# Patient Record
Sex: Male | Born: 1960 | Race: White | Hispanic: No | Marital: Married | Smoking: Never smoker
Health system: Southern US, Community
[De-identification: ages and names within clinical notes are randomized; demographics above are authoritative.]

## PROBLEM LIST (undated history)

## (undated) DIAGNOSIS — B029 Zoster without complications: Secondary | ICD-10-CM

## (undated) DIAGNOSIS — R972 Elevated prostate specific antigen [PSA]: Secondary | ICD-10-CM

## (undated) DIAGNOSIS — H00019 Hordeolum externum unspecified eye, unspecified eyelid: Secondary | ICD-10-CM

## (undated) DIAGNOSIS — L719 Rosacea, unspecified: Secondary | ICD-10-CM

## (undated) DIAGNOSIS — I493 Ventricular premature depolarization: Secondary | ICD-10-CM

## (undated) DIAGNOSIS — K08409 Partial loss of teeth, unspecified cause, unspecified class: Secondary | ICD-10-CM

## (undated) DIAGNOSIS — E785 Hyperlipidemia, unspecified: Secondary | ICD-10-CM

## (undated) HISTORY — DX: Zoster without complications: B02.9

## (undated) HISTORY — DX: Elevated prostate specific antigen (PSA): R97.20

## (undated) HISTORY — DX: Rosacea, unspecified: L71.9

## (undated) HISTORY — DX: Hordeolum externum unspecified eye, unspecified eyelid: H00.019

## (undated) HISTORY — DX: Hyperlipidemia, unspecified: E78.5

## (undated) HISTORY — PX: WISDOM TOOTH EXTRACTION: SHX21

## (undated) HISTORY — DX: Partial loss of teeth, unspecified cause, unspecified class: K08.409

---

## 2006-10-26 DIAGNOSIS — R972 Elevated prostate specific antigen [PSA]: Secondary | ICD-10-CM

## 2006-10-26 DIAGNOSIS — B029 Zoster without complications: Secondary | ICD-10-CM

## 2006-10-26 HISTORY — DX: Zoster without complications: B02.9

## 2006-10-26 HISTORY — DX: Elevated prostate specific antigen (PSA): R97.20

## 2006-10-26 HISTORY — PX: PROSTATE BIOPSY: SHX241

## 2013-10-26 DIAGNOSIS — H00019 Hordeolum externum unspecified eye, unspecified eyelid: Secondary | ICD-10-CM

## 2013-10-26 HISTORY — DX: Hordeolum externum unspecified eye, unspecified eyelid: H00.019

## 2014-02-15 ENCOUNTER — Encounter: Payer: Self-pay | Admitting: Medical

## 2014-02-15 ENCOUNTER — Ambulatory Visit (INDEPENDENT_AMBULATORY_CARE_PROVIDER_SITE_OTHER): Payer: BC Managed Care – PPO | Admitting: Medical

## 2014-02-15 VITALS — BP 124/90 | HR 80 | Temp 97.7°F | Resp 18 | Ht 73.0 in | Wt 208.0 lb

## 2014-02-15 DIAGNOSIS — Z23 Encounter for immunization: Secondary | ICD-10-CM

## 2014-02-15 DIAGNOSIS — Z0000 Encounter for general adult medical examination without abnormal findings: Secondary | ICD-10-CM

## 2014-02-15 DIAGNOSIS — Z1211 Encounter for screening for malignant neoplasm of colon: Secondary | ICD-10-CM

## 2014-02-15 DIAGNOSIS — R9431 Abnormal electrocardiogram [ECG] [EKG]: Secondary | ICD-10-CM

## 2014-02-15 DIAGNOSIS — Z87898 Personal history of other specified conditions: Secondary | ICD-10-CM

## 2014-02-15 LAB — CBC
HCT: 45.5 % (ref 39.0–52.0)
Hemoglobin: 15.5 g/dL (ref 13.0–17.0)
MCH: 30.5 pg (ref 26.0–34.0)
MCHC: 34.1 g/dL (ref 30.0–36.0)
MCV: 89.6 fL (ref 78.0–100.0)
Platelets: 212 10*3/uL (ref 150–400)
RBC: 5.08 MIL/uL (ref 4.22–5.81)
RDW: 12.9 % (ref 11.5–15.5)
WBC: 5.9 10*3/uL (ref 4.0–10.5)

## 2014-02-15 LAB — LIPID PANEL
Cholesterol: 251 mg/dL — ABNORMAL HIGH (ref 0–200)
HDL: 48 mg/dL (ref >39)
LDL Cholesterol: 183 mg/dL — ABNORMAL HIGH (ref 0–99)
Total CHOL/HDL Ratio: 5.2 Ratio
Triglycerides: 98 mg/dL (ref <150)
VLDL: 20 mg/dL (ref 0–40)

## 2014-02-15 LAB — POCT URINALYSIS DIPSTICK
Bilirubin, UA: NEGATIVE
Blood, UA: NEGATIVE
Glucose, UA: NEGATIVE
Ketones, UA: NEGATIVE
Leukocytes, UA: NEGATIVE
Nitrite, UA: NEGATIVE
Protein, UA: NEGATIVE
Spec Grav, UA: 1.01
Urobilinogen, UA: NEGATIVE
pH, UA: 5

## 2014-02-15 LAB — COMPREHENSIVE METABOLIC PANEL
ALT: 25 U/L (ref 0–53)
AST: 24 U/L (ref 0–37)
Albumin: 4.4 g/dL (ref 3.5–5.2)
Alkaline Phosphatase: 46 U/L (ref 39–117)
BUN: 14 mg/dL (ref 6–23)
CO2: 26 mEq/L (ref 19–32)
Calcium: 9.1 mg/dL (ref 8.4–10.5)
Chloride: 101 mEq/L (ref 96–112)
Creat: 0.91 mg/dL (ref 0.50–1.35)
Glucose, Bld: 99 mg/dL (ref 70–99)
Potassium: 4.2 mEq/L (ref 3.5–5.3)
Sodium: 136 mEq/L (ref 135–145)
Total Bilirubin: 0.7 mg/dL (ref 0.2–1.2)
Total Protein: 7 g/dL (ref 6.0–8.3)

## 2014-02-15 NOTE — Progress Notes (Signed)
Subjective:   HPI  Devin Nunez is a 53 y.o. male who presents for a complete physical.  New patient.  Moved from [place] to [place] few years ago.  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 Been in usual state of health without c/o except for hordeolum.   Sees Dr. Dione Booze for this.   Preventative care: 2010 Last ophthalmology visit:2015 Dr Dione Booze Last dental visit:2015 Dr Alvina Filbert Last colonoscopy:never Last prostate exam: biopsy 2010 Last EKG:2008 Last labs: 2010  Prior vaccinations: TD or Tdap:unknown Influenza:no Pneumococcal:no Shingles/Zostavax:no  Advanced directive:no Health care power of attorney:no Living will:no  Concerns: none  Reviewed their medical, surgical, family, social, medication, and allergy history and updated chart as appropriate.  Past Medical History  Diagnosis Date  . Elevated PSA 2008    hx/o with benign biopsy  . Shingles 2008    chest/back  . Hordeolum 2015    sees Dr. Dione Booze, on Doxycycline  . Wisdom teeth removed     Past Surgical History  Procedure Laterality Date  . Prostate biopsy  2008    reportedly benign    History   Social History  . Marital Status: Married    Spouse Name: N/A    Number of Children: N/A  . Years of Education: N/A   Occupational History  . Not on file.   Social History Main Topics  . Smoking status: Never Smoker   . Smokeless tobacco: Not on file  . Alcohol Use: 4.8 oz/week    8 Cans of beer per week  . Drug Use: No  . Sexual Activity: Not on file   Other Topics Concern  . 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 Heart disease Father     CABG in his '80s  . Diabetes Neg Hx   . Hypertension Neg Hx   . Stroke Neg Hx   . Cancer Cousin     colon    Current outpatient prescriptions:doxycycline  (DORYX) 100 MG EC tablet, Take 100 mg by mouth daily., Disp: , Rfl:   No Known Allergies     Review of Systems Constitutional: -fever, -chills, -sweats, -unexpected weight change, -decreased appetite, -fatigue Allergy: -sneezing, -itching, -congestion Dermatology: -changing moles, --rash, -lumps ENT: -runny nose, -ear pain, -sore throat, -hoarseness, -sinus pain, -teeth pain, - ringing in ears, -hearing loss, -nosebleeds Cardiology: -chest pain, -palpitations, -swelling, -difficulty breathing when lying flat, -waking up short of breath Respiratory: -cough, -shortness of breath, -difficulty breathing with exercise or exertion, -wheezing, -coughing up blood Gastroenterology: -abdominal pain, -nausea, -vomiting, -diarrhea, -constipation, +-blood in stool, -changes in bowel movement, -difficulty swallowing or eating Hematology: -bleeding, -bruising  Musculoskeletal: -joint aches, -muscle aches, -joint swelling, -back pain, -neck pain, -cramping, -changes in gait Ophthalmology: denies vision changes, eye redness, itching, discharge Urology: -burning with urination, -difficulty urinating, -blood in urine, -urinary frequency, -urgency, -incontinence Neurology: -headache, -weakness, -tingling, -numbness, -memory loss, -falls, -dizziness Psychology: -depressed mood, -agitation, -sleep problems     Objective:   Physical Exam  BP 124/90  Pulse 80  Temp(Src) 97.7 F (36.5 C) (Oral)  Resp 18  Ht  (1.854 m)  Wt 208 lb (94.348 kg)  BMI 27.45 kg/m2  General appearance: alert, no distress, WD/WN, white male Skin: left eyelid upper with mild erythema and hordeolum noted, otherwise scattered macules,no worrisome  lesions HEENT: normocephalic, conjunctiva/corneas normal, sclerae anicteric, PERRLA, EOMi, nares patent, no discharge or erythema, pharynx normal Oral cavity: MMM, tongue normal, teeth normal Neck: supple, no lymphadenopathy, no thyromegaly, no masses, normal ROM, no bruits Chest:  non tender, normal shape and expansion Heart: RRR, normal S1, S2, no murmurs Lungs: CTA bilaterally, no wheezes, rhonchi, or rales Abdomen: +bs, soft, non tender, non distended, no masses, no hepatomegaly, no splenomegaly, no bruits Back: non tender, normal ROM, no scoliosis Musculoskeletal: upper extremities non tender, no obvious deformity, normal ROM throughout, lower extremities non tender, no obvious deformity, normal ROM throughout Extremities: no edema, no cyanosis, no clubbing Pulses: His toes and feet are cool to the touch, decreased cap refill but he says this is not typical for him and it is cold in exam room, otherwise 2+ symmetric, upper and lower extremities, normal cap refill Neurological: alert, oriented x 3, CN2-12 intact, strength normal upper extremities and lower extremities, sensation normal throughout, DTRs 2+ throughout, no cerebellar signs, gait normal Psychiatric: normal affect, behavior normal, pleasant  GU: normal male external genitalia, circumcised, nontender, no masses, no hernia, no lymphadenopathy Rectal: anus normal tone, prostate WNL, occult negative stool   Adult ECG Report  Indication: skipped beats on pulse exam  Rate: 92bpm  Rhythm: normal sinus rhythm and premature ventricular contractions (PVC)  QRS Axis: 21 degrees  PR Interval:  QRS Duration: 78ms  QTc:  Conduction Disturbances: none  Other Abnormalities: none  Patient's cardiac risk factors are: male gender and sedentary lifestyle.  EKG comparison: none  Narrative Interpretation: PVCs, no acute changes otherwise     Assessment and Plan :      Encounter Diagnoses  Name Primary?  . Routine general medical examination at a health care facility Yes  . Nonspecific abnormal electrocardiogram (ECG) (EKG)   . Need for diphtheria-tetanus-pertussis (Tdap) vaccine, adult/adolescent   . History of elevated PSA   . Special screening for malignant neoplasms, colon     Physical exam -  discussed healthy lifestyle, diet, exercise, preventative care, vaccinations, and addressed their concerns.   EKG abnormal - PVCs, asymptomatic, borderling DBP.  Will monitor.  Consider stress test or echo pending labs.  tdap vaccine counseling given, VIS given and Tdap administered. Hx/o elevated PSA  - normal prostate exam, f/u pending PSA lab Referral for first colonoscopy Follow-up pending labs

## 2014-02-16 LAB — PSA: PSA: 1.62 ng/mL (ref <=4.00)

## 2014-02-16 LAB — TSH: TSH: 1.238 u[IU]/mL (ref 0.350–4.500)

## 2014-02-23 ENCOUNTER — Other Ambulatory Visit: Payer: Self-pay | Admitting: Medical

## 2014-02-23 MED ORDER — ATORVASTATIN CALCIUM 20 MG PO TABS: 20.0000 mg | ORAL_TABLET | Freq: Every day | ORAL | Status: DC

## 2014-02-26 ENCOUNTER — Other Ambulatory Visit: Payer: Self-pay | Admitting: Family Medicine

## 2014-02-26 DIAGNOSIS — R9431 Abnormal electrocardiogram [ECG] [EKG]: Secondary | ICD-10-CM

## 2014-03-02 ENCOUNTER — Telehealth (HOSPITAL_COMMUNITY): Payer: Self-pay | Admitting: *Deleted

## 2014-03-02 ENCOUNTER — Encounter: Payer: Self-pay | Admitting: Cardiology

## 2014-03-02 DIAGNOSIS — I493 Ventricular premature depolarization: Secondary | ICD-10-CM | POA: Insufficient documentation

## 2014-03-02 DIAGNOSIS — E785 Hyperlipidemia, unspecified: Secondary | ICD-10-CM

## 2014-03-02 NOTE — Progress Notes (Unsigned)
Patient ID: Devin Nunez, male   DOB: 11/15/1960, 53 y.o.   MRN: [id number]   Devin Nunez  Date of visit:  03/02/2014 DOB:  04-21-1961    Age:  53 yrs. Medical record number:  [id number] Account number:  [id number] Primary Care Provider: [name], Devin Nunez ____________________________ CURRENT DIAGNOSES  1. PVC''s  2. Hyperlipidemia, unspecified  ____________________________ ALLERGIES  No Known Allergies ____________________________ MEDICATIONS  1. doxycycline hyclate 100 mg capsule, 1 p.o. daily  2. atorvastatin 20 mg tablet, 1 p.o. daily  3. aspirin 81 mg chewable tablet, 1 p.o. daily ____________________________ CHIEF COMPLAINTS  Irrg puse ____________________________ HISTORY OF PRESENT ILLNESS  This very nice 53 year old male is seen for evaluation of PVCs. He recently saw his primary care provider for a physical and PVCs were noted. These are asymptomatic. The patient was diagnosed with hyperlipidemia and was treated with a dose of statin therapy. He does have a family history of bypass surgery and his father at an older age of 54. He denies hypertension and has no PND, orthopnea, syncope, or palpitations. He is unaware that he has PVCs. He drinks occasional beer on the weekend but does not use caffeine to excess and has no other medical issues. ____________________________ PAST HISTORY  Past Medical Illnesses:  hyperlipidemia;  Cardiovascular Illnesses:  arrhythmia-PVCs;  Surgical Procedures:  wisdom teeth extraction, prostate bx;  Cardiology Procedures-Invasive:  no history of prior cardiac procedures;  Cardiology Procedures-Noninvasive:  no previous non-invasive procedures;  LVEF not documented,   ____________________________ CARDIO-PULMONARY TEST DATES EKG Date:  03/02/2014;   ____________________________ FAMILY HISTORY Brother -- Brother alive and well Brother -- Brother alive and well Brother -- Brother alive and well Father -- Father alive with problem, Coronary artery bypass  grafting Mother -- Mother alive and well Sister -- Sister alive and well ____________________________ SOCIAL HISTORY Alcohol Use:  8 beer q week;  Smoking:  never smoked;  Diet:  regular diet;  Lifestyle:  married, 1 son and 1 daughter;  Exercise:  no regular exercise;  Occupation:  owns Therapist, 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 Eyes: eyelid infection Ears, Nose, Throat, Mouth:  denies any hearing loss, epistaxis, hoarseness or difficulty speaking. Respiratory: denies dyspnea, cough, wheezing or hemoptysis. Cardiovascular:  please review HPI Abdominal: denies dyspepsia, GI bleeding, constipation, or diarrhea Genitourinary-Male: no dysuria, urgency, frequency, or nocturia  Musculoskeletal:  denies arthritis, venous insufficiency, or muscle weakness Neurological:  denies headaches, stroke, or TIA  ____________________________ PHYSICAL EXAMINATION VITAL SIGNS  Blood Pressure:  136/60 Sitting, Right arm, regular cuff  , 124/68 Standing, Right arm and regular cuff   Pulse:  78/min. Weight:  211.00 lbs. Height:  71"BMI: 29  Constitutional:  pleasant white male in no acute distress Skin:  warm and dry to touch, no apparent skin lesions, or masses noted. Head:  normocephalic, normal hair pattern, no masses or tenderness Eyes:  EOMS Intact, PERRLA, C and S clear, Funduscopic exam not done. ENT:  ears, nose and throat reveal no gross abnormalities.  Dentition good. Neck:  supple, without massess. No JVD, thyromegaly or carotid bruits. Carotid upstroke normal. Chest:  normal symmetry, clear to auscultation. Cardiac:  regular rhythm, normal S1 and S2, No S3 or S4, no murmurs, gallops or rubs detected. Abdomen:  abdomen soft,non-tender, no masses, no hepatospenomegaly, or aneurysm noted Peripheral Pulses:  the femoral,dorsalis  pedis, and  posterior tibial pulses are full and equal bilaterally with no bruits auscultated. Extremities & Back:  no deformities, clubbing, cyanosis, erythema or edema observed. Normal muscle strength and tone. Neurological:  no gross motor or sensory deficits noted, affect appropriate, oriented x3. ____________________________ MOST RECENT LIPID PANEL 02/15/14  CHOL TOTL 251 mg/dl, LDL 098183 NM, HDL 48 mg/dl, TRIGLYCER 98 mg/dl,  CHOL/HDL 5.2 (Calc)  ____________________________ IMPRESSIONS/PLAN  1. Asymptomatic PVCs 2. Overweight 3. Hyperlipidemia under treatment  Recommendations:  Patient education regarding PVCs. At this point I will like him to have an echocardiogram to be sure that he does not have any structural heart disease and otherwise I think he can be reassured. No treatment is necessary for the PVCs. He was encouraged to continue to try to lose weight and engage in healthy lifestyle behaviors. ____________________________ TODAYS ORDERS  1. 2D, color flow, doppler: At Patient [facility]  2. 12 Lead EKG: Today                       ____________________________ Cardiology Physician:  Darden PalmerW. Spencer Jackie Littlejohn, Jr. MD [facility]

## 2014-03-06 ENCOUNTER — Telehealth: Payer: Self-pay | Admitting: Family Medicine

## 2014-03-06 NOTE — Telephone Encounter (Signed)
PATIENT IS AWARE OF HIS APPOINTMENT TO SEE DR. MANN ON ,AY 12, 2015 @ 945 AM. [phone]  i [facility]AC[facility] C[facility]

## 2014-03-08 ENCOUNTER — Telehealth (HOSPITAL_COMMUNITY): Payer: Self-pay | Admitting: *Deleted

## 2014-03-09 ENCOUNTER — Telehealth (HOSPITAL_COMMUNITY): Payer: Self-pay | Admitting: *Deleted

## 2014-03-30 ENCOUNTER — Encounter: Payer: Self-pay | Admitting: Medical

## 2014-04-25 HISTORY — PX: COLONOSCOPY: SHX174

## 2014-05-25 ENCOUNTER — Encounter: Payer: Self-pay | Admitting: Internal Medicine

## 2014-05-25 LAB — HM COLONOSCOPY

## 2014-06-16 ENCOUNTER — Other Ambulatory Visit: Payer: Self-pay | Admitting: Medical

## 2014-10-01 ENCOUNTER — Other Ambulatory Visit: Payer: Self-pay | Admitting: Medical

## 2014-12-06 ENCOUNTER — Other Ambulatory Visit: Payer: Self-pay | Admitting: Medical

## 2015-02-08 ENCOUNTER — Other Ambulatory Visit: Payer: Self-pay | Admitting: Medical

## 2015-03-08 ENCOUNTER — Other Ambulatory Visit: Payer: Self-pay | Admitting: Medical

## 2016-10-02 ENCOUNTER — Encounter: Payer: Self-pay | Admitting: Medical

## 2016-10-30 ENCOUNTER — Encounter: Payer: Self-pay | Admitting: Medical

## 2016-10-30 ENCOUNTER — Ambulatory Visit (INDEPENDENT_AMBULATORY_CARE_PROVIDER_SITE_OTHER): Payer: BLUE CROSS/BLUE SHIELD | Admitting: Medical

## 2016-10-30 ENCOUNTER — Telehealth: Payer: Self-pay | Admitting: Medical

## 2016-10-30 VITALS — BP 128/84 | HR 86 | Ht 73.5 in | Wt 208.6 lb

## 2016-10-30 DIAGNOSIS — Z0000 Encounter for general adult medical examination without abnormal findings: Secondary | ICD-10-CM | POA: Diagnosis not present

## 2016-10-30 DIAGNOSIS — Z125 Encounter for screening for malignant neoplasm of prostate: Secondary | ICD-10-CM

## 2016-10-30 DIAGNOSIS — Z800 Family history of malignant neoplasm of digestive organs: Secondary | ICD-10-CM | POA: Insufficient documentation

## 2016-10-30 DIAGNOSIS — E785 Hyperlipidemia, unspecified: Secondary | ICD-10-CM

## 2016-10-30 DIAGNOSIS — Z1329 Encounter for screening for other suspected endocrine disorder: Secondary | ICD-10-CM | POA: Insufficient documentation

## 2016-10-30 DIAGNOSIS — Z8249 Family history of ischemic heart disease and other diseases of the circulatory system: Secondary | ICD-10-CM | POA: Diagnosis not present

## 2016-10-30 LAB — POCT URINALYSIS DIPSTICK
Bilirubin, UA: NEGATIVE
Blood, UA: NEGATIVE
Clarity, UA: NEGATIVE
Glucose, UA: NEGATIVE
Ketones, UA: NEGATIVE
Leukocytes, UA: NEGATIVE
Nitrite, UA: NEGATIVE
Protein, UA: NEGATIVE
Spec Grav, UA: >=1.03
Urobilinogen, UA: NEGATIVE
pH, UA: 6

## 2016-10-30 LAB — LIPID PANEL
Cholesterol: 274 mg/dL — ABNORMAL HIGH (ref <200)
HDL: 59 mg/dL (ref >40)
LDL Cholesterol: 181 mg/dL — ABNORMAL HIGH (ref <100)
Total CHOL/HDL Ratio: 4.6 Ratio (ref <5.0)
Triglycerides: 168 mg/dL — ABNORMAL HIGH (ref <150)
VLDL: 34 mg/dL — ABNORMAL HIGH (ref <30)

## 2016-10-30 LAB — COMPREHENSIVE METABOLIC PANEL
ALT: 39 U/L (ref 9–46)
AST: 34 U/L (ref 10–35)
Albumin: 4.3 g/dL (ref 3.6–5.1)
Alkaline Phosphatase: 44 U/L (ref 40–115)
BUN: 15 mg/dL (ref 7–25)
CO2: 26 mmol/L (ref 20–31)
Calcium: 9.4 mg/dL (ref 8.6–10.3)
Chloride: 105 mmol/L (ref 98–110)
Creat: 0.92 mg/dL (ref 0.70–1.33)
Glucose, Bld: 109 mg/dL — ABNORMAL HIGH (ref 65–99)
Potassium: 4.7 mmol/L (ref 3.5–5.3)
Sodium: 139 mmol/L (ref 135–146)
Total Bilirubin: 0.7 mg/dL (ref 0.2–1.2)
Total Protein: 7 g/dL (ref 6.1–8.1)

## 2016-10-30 LAB — CBC WITH DIFFERENTIAL/PLATELET
Basophils Absolute: 44 cells/uL (ref 0–200)
Basophils Relative: 1 %
Eosinophils Absolute: 132 cells/uL (ref 15–500)
Eosinophils Relative: 3 %
HCT: 45 % (ref 38.5–50.0)
Hemoglobin: 15 g/dL (ref 13.2–17.1)
Lymphocytes Relative: 29 %
Lymphs Abs: 1276 cells/uL (ref 850–3900)
MCH: 30.4 pg (ref 27.0–33.0)
MCHC: 33.3 g/dL (ref 32.0–36.0)
MCV: 91.3 fL (ref 80.0–100.0)
MPV: 9.5 fL (ref 7.5–12.5)
Monocytes Absolute: 660 cells/uL (ref 200–950)
Monocytes Relative: 15 %
Neutro Abs: 2288 cells/uL (ref 1500–7800)
Neutrophils Relative %: 52 %
Platelets: 224 10*3/uL (ref 140–400)
RBC: 4.93 MIL/uL (ref 4.20–5.80)
RDW: 13 % (ref 11.0–15.0)
WBC: 4.4 10*3/uL (ref 4.0–10.5)

## 2016-10-30 LAB — HEMOGLOBIN A1C
Hgb A1c MFr Bld: 5.3 % (ref <5.7)
Mean Plasma Glucose: 105 mg/dL

## 2016-10-30 LAB — PSA: PSA: 2.8 ng/mL (ref <=4.0)

## 2016-10-30 NOTE — Progress Notes (Signed)
Subjective:   HPI Chief Complaint  Patient presents with  . physical    physical     Devin Nunez is a 56 y.o. male who presents for a complete physical.  Last visit 2015.  Been doing well.  Concerned about heart disease.  Eats relatively healthy, exercises, tries to stay in good health, but has heart disease in father and brother's history.    Reviewed their medical, surgical, family, social, medication, and allergy history and updated chart as appropriate.  Past Medical History:  Diagnosis Date  . Elevated PSA 2008   hx/o with benign biopsy  . Hordeolum 2015   sees Dr. Dione BoozeGroat, on Doxycycline  . Shingles 2008   chest/back  . Wisdom teeth removed     Past Surgical History:  Procedure Laterality Date  . COLONOSCOPY  04/2014   normal colonoscopy, Dr. Loreta AveMann  . PROSTATE BIOPSY  2008   reportedly benign    Social History   Social History  . Marital status: Married    Spouse name: N/A  . Number of children: N/A  . Years of education: N/A   Occupational History  . Not on file.   Social History Main Topics  . Smoking status: Never Smoker  . Smokeless tobacco: Never Used  . Alcohol use 4.8 oz/week    8 Cans of beer per week  . Drug use: No  . Sexual activity: Not on file   Other Topics Concern  . 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  10/2016    Family History  Problem Relation Age of Onset  . Heart disease Father 3886    MI, CABG  . Heart disease Brother [id number]    CAD severe  . Cancer Cousin     colon  . Diabetes Neg Hx   . Hypertension Neg Hx   . Stroke Neg Hx      Current Outpatient Prescriptions:  .  atorvastatin (LIPITOR) 20 MG tablet, TAKE ONE TABLET BY MOUTH ONE TIME DAILY, Disp: 30 tablet, Rfl: 0  No Known Allergies   Review of  Systems Constitutional: -fever, -chills, -sweats, -unexpected weight change, -decreased appetite, -fatigue Allergy: -sneezing, -itching, -congestion Dermatology: -changing moles, --rash, -lumps ENT: -runny nose, -ear pain, -sore throat, -hoarseness, -sinus pain, -teeth pain, - ringing in ears, -hearing loss, -nosebleeds Cardiology: -chest pain, -palpitations, -swelling, -difficulty breathing when lying flat, -waking up short of breath Respiratory: -cough, -shortness of breath, -difficulty breathing with exercise or exertion, -wheezing, -coughing up blood Gastroenterology: -abdominal pain, -nausea, -vomiting, -diarrhea, -constipation, -blood in stool, -changes in bowel movement, -difficulty swallowing or eating Hematology: -bleeding, -bruising  Musculoskeletal: -joint aches, -muscle aches, -joint swelling, -back pain, -neck pain, -cramping, -changes in gait Ophthalmology: denies vision changes, eye redness, itching, discharge Urology: -burning with urination, -difficulty urinating, -blood in urine, -urinary frequency, -urgency, -incontinence Neurology: -headache, -weakness, -tingling, -numbness, -memory loss, -falls, -dizziness Psychology: -depressed mood, -agitation, -sleep problems     Objective:   Physical Exam  BP 128/84   Pulse 86   Ht 6' 1.5" (1.867 m)   Wt 208 lb 9.6 oz (94.6 kg)   SpO2 98%   BMI 27.15 kg/m   General appearance: alert, no distress, WD/WN, white male Skin: left lower cheek with 2mm diameter raised papular flesh colored lesion, right nare fold with  2mm diameter raised pink lesion, right cheek with 3mm diameter raised papular lesion, all benign appearing, scattered macules HEENT: normocephalic, conjunctiva/corneas normal, sclerae anicteric, PERRLA, EOMi, nares patent, no discharge or erythema, pharynx normal Oral cavity: MMM, tongue normal, teeth in good repair Neck: supple, no lymphadenopathy, no thyromegaly, no masses, normal ROM, no bruits Chest: non tender,  normal shape and expansion Heart: RRR, normal S1, S2, no murmurs Lungs: CTA bilaterally, no wheezes, rhonchi, or rales Abdomen: +bs, soft, non tender, non distended, no masses, no hepatomegaly, no splenomegaly, no bruits Back: non tender, normal ROM, no scoliosis Musculoskeletal: upper extremities non tender, no obvious deformity, normal ROM throughout, lower extremities non tender, no obvious deformity, normal ROM throughout Extremities: no edema, no cyanosis, no clubbing Pulses: 2+ symmetric, upper and lower extremities, normal cap refill Neurological: alert, oriented x 3, CN2-12 intact, strength normal upper extremities and lower extremities, sensation normal throughout, DTRs 2+ throughout, no cerebellar signs, gait normal Psychiatric: normal affect, behavior normal, pleasant  GU: normal male external genitalia, circumcised, nontender, no masses, no hernia, no lymphadenopathy Rectal: anus nontender, no nodules, prostate WNL   Adult ECG Report  Indication: physical, family history of heart disease  Rate: 74 bpm  Rhythm: normal sinus rhythm  QRS Axis: 36 degrees  PR Interval:  QRS Duration: 78ms  QTc:  Conduction Disturbances: none  Other Abnormalities: possible AV block 3 or conduction abnormality given unusual but rhythmic pattern of various p waves in II, different than 2015 EKG  Patient's cardiac risk factors are: advanced age (older than 10 for men, 34 for women), dyslipidemia and male gender.  EKG comparison: 01/2014  Narrative Interpretation: possible AV block 3 or conduction abnormality given unusual but rhythmic pattern of various p waves in II, different than 2015 EKG   Assessment and Plan :    Encounter Diagnoses  Name Primary?  . Encounter for health maintenance examination in adult Yes  . Hyperlipidemia, unspecified hyperlipidemia type   . Family history of colon cancer   . Family history of heart disease   . Screening for prostate cancer     Physical  exam - discussed healthy lifestyle, diet, exercise, preventative care, vaccinations, and addressed their concerns.   See your eye doctor yearly for routine vision care. See your dentist yearly for routine dental care including hygiene visits twice yearly. Discussed risk factor reduction regarding heart disease risks and family hx/o.  Discussed diet, exercise, labs today. Advised he check insurance coverage for cardiac calcium score test, chest CT, also call insurance coverage for coverage for shingrix vaccine Plan to repeat colonoscopy in 3 years given family history and age Follow-up pending labs  Addendum: after he left, I reviewed EKG with Dr. Susann Givens and Dr. Lynelle Doctor supervising physicians.  questionable AV block type 3 vs other abnormality of the conduction and p waves.   Pending labs, consider consult back with Dr. Donnie Aho.

## 2016-10-30 NOTE — Patient Instructions (Signed)
Recommendations: See your eye doctor yearly for routine vision care.  See your dentist yearly for routine dental care including hygiene visits twice yearly.  Consider reducing meat consumption, specifically red meat  Exercise 30-60 minutes daily at a heart rate of 120 beats /min  Consider dermatology follow up this year  Plan for repeat colonoscopy in 5 years given age and family history   Health Maintenance, Male A healthy lifestyle and preventative care can promote health and wellness.  Maintain regular health, dental, and eye exams.  Eat a healthy diet. Foods like vegetables, fruits, whole grains, low-fat dairy products, and lean protein foods contain the nutrients you need and are low in calories. Decrease your intake of foods high in solid fats, added sugars, and salt. Get information about a proper diet from your health care provider, if necessary.  Regular physical exercise is one of the most important things you can do for your health. Most adults should get at least 150 minutes of moderate-intensity exercise (any activity that increases your heart rate and causes you to sweat) each week. In addition, most adults need muscle-strengthening exercises on 2 or more days a week.   Maintain a healthy weight. The body mass index (BMI) is a screening tool to identify possible weight problems. It provides an estimate of body fat based on height and weight. Your health care provider can find your BMI and can help you achieve or maintain a healthy weight. For males 20 years and older:  A BMI below 18.5 is considered underweight.  A BMI of 18.5 to 24.9 is normal.  A BMI of 25 to 29.9 is considered overweight.  A BMI of 30 and above is considered obese.  Maintain normal blood lipids and cholesterol by exercising and minimizing your intake of saturated fat. Eat a balanced diet with plenty of fruits and vegetables. Blood tests for lipids and cholesterol should begin at age 66 and be  repeated every 5 years. If your lipid or cholesterol levels are high, you are over age 85, or you are at high risk for heart disease, you may need your cholesterol levels checked more frequently.Ongoing high lipid and cholesterol levels should be treated with medicines if diet and exercise are not working.  If you smoke, find out from your health care provider how to quit. If you do not use tobacco, do not start.  Lung cancer screening is recommended for adults aged 55-80 years who are at high risk for developing lung cancer because of a history of smoking. A yearly low-dose CT scan of the lungs is recommended for people who have at least a 30-pack-year history of smoking and are current smokers or have quit within the past 15 years. A pack year of smoking is smoking an average of 1 pack of cigarettes a day for 1 year (for example, a 30-pack-year history of smoking could mean smoking 1 pack a day for 30 years or 2 packs a day for 15 years). Yearly screening should continue until the smoker has stopped smoking for at least 15 years. Yearly screening should be stopped for people who develop a health problem that would prevent them from having lung cancer treatment.  If you choose to drink alcohol, do not have more than 2 drinks per day. One drink is considered to be 12 oz (360 mL) of beer, 5 oz (150 mL) of wine, or 1.5 oz (45 mL) of liquor.  Avoid the use of street drugs. Do not share needles with anyone.  Ask for help if you need support or instructions about stopping the use of drugs.  High blood pressure causes heart disease and increases the risk of stroke. High blood pressure is more likely to develop in:  People who have blood pressure in the end of the normal range (100-139/85-89 mm Hg).  People who are overweight or obese.  People who are African American.  If you are 73-1 years of age, have your blood pressure checked every 3-5 years. If you are 7 years of age or older, have your blood  pressure checked every year. You should have your blood pressure measured twice-once when you are at a hospital or clinic, and once when you are not at a hospital or clinic. Record the average of the two measurements. To check your blood pressure when you are not at a hospital or clinic, you can use:  An automated blood pressure machine at a pharmacy.  A home blood pressure monitor.  If you are 32-102 years old, ask your health care provider if you should take aspirin to prevent heart disease.  Diabetes screening involves taking a blood sample to check your fasting blood sugar level. This should be done once every 3 years after age 81 if you are at a normal weight and without risk factors for diabetes. Testing should be considered at a younger age or be carried out more frequently if you are overweight and have at least 1 risk factor for diabetes.  Colorectal cancer can be detected and often prevented. Most routine colorectal cancer screening begins at the age of 42 and continues through age 16. However, your health care provider may recommend screening at an earlier age if you have risk factors for colon cancer. On a yearly basis, your health care provider may provide home test kits to check for hidden blood in the stool. A small camera at the end of a tube may be used to directly examine the colon (sigmoidoscopy or colonoscopy) to detect the earliest forms of colorectal cancer. Talk to your health care provider about this at age 85 when routine screening begins. A direct exam of the colon should be repeated every 5-10 years through age 66, unless early forms of precancerous polyps or small growths are found.  People who are at an increased risk for hepatitis B should be screened for this virus. You are considered at high risk for hepatitis B if:  You were born in a country where hepatitis B occurs often. Talk with your health care provider about which countries are considered high risk.  Your  parents were born in a high-risk country and you have not received a shot to protect against hepatitis B (hepatitis B vaccine).  You have HIV or AIDS.  You use needles to inject street drugs.  You live with, or have sex with, someone who has hepatitis B.  You are a man who has sex with other men (MSM).  You get hemodialysis treatment.  You take certain medicines for conditions like cancer, organ transplantation, and autoimmune conditions.  Hepatitis C blood testing is recommended for all people born from [birth year] through 1966 and any individual with known risk factors for hepatitis C.  Healthy men should no longer receive prostate-specific antigen (PSA) blood tests as part of routine cancer screening. Talk to your health care provider about prostate cancer screening.  Testicular cancer screening is not recommended for adolescents or adult males who have no symptoms. Screening includes self-exam, a health care provider exam, and  other screening tests. Consult with your health care provider about any symptoms you have or any concerns you have about testicular cancer.  Practice safe sex. Use condoms and avoid high-risk sexual practices to reduce the spread of sexually transmitted infections (STIs).  You should be screened for STIs, including gonorrhea and chlamydia if:  You are sexually active and are younger than 24 years.  You are older than 24 years, and your health care provider tells you that you are at risk for this type of infection.  Your sexual activity has changed since you were last screened, and you are at an increased risk for chlamydia or gonorrhea. Ask your health care provider if you are at risk.  If you are at risk of being infected with HIV, it is recommended that you take a prescription medicine daily to prevent HIV infection. This is called pre-exposure prophylaxis (PrEP). You are considered at risk if:  You are a man who has sex with other men (MSM).  You are a  heterosexual man who is sexually active with multiple partners.  You take drugs by injection.  You are sexually active with a partner who has HIV.  Talk with your health care provider about whether you are at high risk of being infected with HIV. If you choose to begin PrEP, you should first be tested for HIV. You should then be tested every 3 months for as long as you are taking PrEP.  Use sunscreen. Apply sunscreen liberally and repeatedly throughout the day. You should seek shade when your shadow is shorter than you. Protect yourself by wearing long sleeves, pants, a wide-brimmed hat, and sunglasses year round whenever you are outdoors.  Tell your health care provider of new moles or changes in moles, especially if there is a change in shape or color. Also, tell your health care provider if a mole is larger than the size of a pencil eraser.  A one-time screening for abdominal aortic aneurysm (AAA) and surgical repair of large AAAs by ultrasound is recommended for men aged 65-75 years who are current or former smokers.  Stay current with your vaccines (immunizations). This information is not intended to replace advice given to you by your health care provider. Make sure you discuss any questions you have with your health care provider. Document Released: 04/09/2008 Document Revised: 11/02/2014 Document Reviewed: 07/16/2015 Elsevier Interactive Patient Education  2017 [employer].    Mediterranean Diet A Mediterranean diet refers to food and lifestyle choices that are based on the traditions of countries located on the [employer]. This way of eating has been shown to help prevent certain conditions and improve outcomes for people who have chronic diseases, like kidney disease and heart disease. What are tips for following this plan? Lifestyle  Cook and eat meals together with your family, when possible.  Drink enough fluid to keep your urine clear or pale yellow.  Be  physically active every day. This includes:  Aerobic exercise like running or swimming.  Leisure activities like gardening, walking, or housework.  Get 7-8 hours of sleep each night.  If recommended by your health care provider, drink red wine in moderation. This means 1 glass a day for nonpregnant women and 2 glasses a day for men. A glass of wine equals 5 oz (150 mL). Reading food labels  Check the serving size of packaged foods. For foods such as rice and pasta, the serving size refers to the amount of cooked product, not dry.  Check  the total fat in packaged foods. Avoid foods that have saturated fat or trans fats.  Check the ingredients list for added sugars, such as corn syrup. Shopping  At the grocery store, buy most of your food from the areas near the walls of the store. This includes:  Fresh fruits and vegetables (produce).  Grains, beans, nuts, and seeds. Some of these may be available in unpackaged forms or large amounts (in bulk).  Fresh seafood.  Poultry and eggs.  Low-fat dairy products.  Buy whole ingredients instead of prepackaged foods.  Buy fresh fruits and vegetables in-season from local farmers markets.  Buy frozen fruits and vegetables in resealable bags.  If you do not have access to quality fresh seafood, buy precooked frozen shrimp or canned fish, such as tuna, salmon, or sardines.  Buy small amounts of raw or cooked vegetables, salads, or olives from the deli or salad bar at your store.  Stock your pantry so you always have certain foods on hand, such as olive oil, canned tuna, canned tomatoes, rice, pasta, and beans. Cooking  Cook foods with extra-virgin olive oil instead of using butter or other vegetable oils.  Have meat as a side dish, and have vegetables or grains as your main dish. This means having meat in small portions or adding small amounts of meat to foods like pasta or stew.  Use beans or vegetables instead of meat in common dishes  like chili or lasagna.  Experiment with different cooking methods. Try roasting or broiling vegetables instead of steaming or sauteing them.  Add frozen vegetables to soups, stews, pasta, or rice.  Add nuts or seeds for added healthy fat at each meal. You can add these to yogurt, salads, or vegetable dishes.  Marinate fish or vegetables using olive oil, lemon juice, garlic, and fresh herbs. Meal planning  Plan to eat 1 vegetarian meal one day each week. Try to work up to 2 vegetarian meals, if possible.  Eat seafood 2 or more times a week.  Have healthy snacks readily available, such as:  Vegetable sticks with hummus.  Greek yogurt.  Fruit and nut trail mix.  Eat balanced meals throughout the week. This includes:  Fruit: 2-3 servings a day  Vegetables: 4-5 servings a day  Low-fat dairy: 2 servings a day  Fish, poultry, or lean meat: 1 serving a day  Beans and legumes: 2 or more servings a week  Nuts and seeds: 1-2 servings a day  Whole grains: 6-8 servings a day  Extra-virgin olive oil: 3-4 servings a day  Limit red meat and sweets to only a few servings a month What are my food choices?  Mediterranean diet  Recommended  Grains: Whole-grain pasta. Brown rice. Bulgar wheat. Polenta. Couscous. Whole-wheat bread. Orpah Cobb.  Vegetables: Artichokes. Beets. Broccoli. Cabbage. Carrots. Eggplant. Green beans. Chard. Kale. Spinach. Onions. Leeks. Peas. Squash. Tomatoes. Peppers. Radishes.  Fruits: Apples. Apricots. Avocado. Berries. Bananas. Cherries. Dates. Figs. Grapes. Lemons. Melon. Oranges. Peaches. Plums. Pomegranate.  Meats and other protein foods: Beans. Almonds. Sunflower seeds. Pine nuts. Peanuts. Cod. Salmon. Scallops. Shrimp. Tuna. Tilapia. Clams. Oysters. Eggs.  Dairy: Low-fat milk. Cheese. Greek yogurt.  Beverages: Water. Red wine. Herbal tea.  Fats and oils: Extra virgin olive oil. Avocado oil. Grape seed oil.  Sweets and desserts: [place]  yogurt with honey. Baked apples. Poached pears. Trail mix.  Seasoning and other foods: Basil. Cilantro. Coriander. Cumin. Mint. Parsley. Sage. Rosemary. Tarragon. Garlic. Oregano. Thyme. Pepper. Balsalmic vinegar. Tahini. Hummus. Tomato sauce.  Olives. Mushrooms.  Limit these  Grains: Prepackaged pasta or rice dishes. Prepackaged cereal with added sugar.  Vegetables: Deep fried potatoes (french fries).  Fruits: Fruit canned in syrup.  Meats and other protein foods: Beef. Pork. Lamb. Poultry with skin. Hot dogs. Tomasa BlaseBacon.  Dairy: Ice cream. Sour cream. Whole milk.  Beverages: Juice. Sugar-sweetened soft drinks. Beer. Liquor and spirits.  Fats and oils: Butter. Canola oil. Vegetable oil. Beef fat (tallow). Lard.  Sweets and desserts: Cookies. Cakes. Pies. Candy.  Seasoning and other foods: Mayonnaise. Premade sauces and marinades.  The items listed may not be a complete list. Talk with your dietitian about what dietary choices are right for you. Summary  The Mediterranean diet includes both food and lifestyle choices.  Eat a variety of fresh fruits and vegetables, beans, nuts, seeds, and whole grains.  Limit the amount of red meat and sweets that you eat.  Talk with your health care provider about whether it is safe for you to drink red wine in moderation. This means 1 glass a day for nonpregnant women and 2 glasses a day for men. A glass of wine equals 5 oz (150 mL). This information is not intended to replace advice given to you by your health care provider. Make sure you discuss any questions you have with your health care provider. Document Released: 06/04/2016 Document Revised: 07/07/2016 Document Reviewed: 06/04/2016 Elsevier Interactive Patient Education  2017 [employer]

## 2016-10-30 NOTE — Telephone Encounter (Signed)
After [name] scans EKG, please send copy of the 2015 and current EKG to cardiology, and send note to ask Dr. Donnie Ahoilley to review the new lead II EKG changes.   Dr. Susann GivensLalonde and myself reviewed, and we have questions about the P waves, possible AV block type III, dissociation.  We did EKG on physical today, he has no symptoms, but since he saw Dr Donnie Ahoilley in 2015, his brother in addition to his father has significant CAD and abnormal calcium score.    We wanted to see if Dr. Donnie Ahoilley was concerned about the EKG, to see if he wanted to see him back?

## 2016-11-02 LAB — HIGH SENSITIVITY CRP: CRP, High Sensitivity: 1.7 mg/L

## 2016-11-02 NOTE — Telephone Encounter (Signed)
Faxed ekg, to [email or web address].

## 2016-11-03 ENCOUNTER — Other Ambulatory Visit: Payer: Self-pay | Admitting: Medical

## 2016-11-03 MED ORDER — ATORVASTATIN CALCIUM 20 MG PO TABS: 20.0000 mg | ORAL_TABLET | Freq: Every day | ORAL | 1 refills | Status: DC

## 2016-11-03 MED ORDER — ASPIRIN EC 81 MG PO TBEC: 81.0000 mg | DELAYED_RELEASE_TABLET | Freq: Every day | ORAL | 3 refills | Status: DC

## 2016-11-03 NOTE — Telephone Encounter (Signed)
Already did  this on yesterday to dr.[email or web address] office.

## 2016-11-03 NOTE — Telephone Encounter (Signed)
Already did this, as in you have referred him and sent EKG?  I want to refer him for appt after discussing this with Dr. Susann GivensLalonde

## 2016-11-03 NOTE — Telephone Encounter (Signed)
See lab message 

## 2016-11-04 NOTE — Telephone Encounter (Signed)
I sent this ekg and referral to dr.[email or web address] office, made an appt.

## 2017-02-05 ENCOUNTER — Ambulatory Visit: Payer: Self-pay | Admitting: Medical

## 2017-02-19 ENCOUNTER — Ambulatory Visit: Payer: Self-pay | Admitting: Medical

## 2017-03-19 ENCOUNTER — Ambulatory Visit: Payer: Self-pay | Admitting: Medical

## 2017-04-23 ENCOUNTER — Ambulatory Visit: Payer: Self-pay | Admitting: Medical

## 2017-04-25 ENCOUNTER — Other Ambulatory Visit: Payer: Self-pay | Admitting: Medical

## 2017-06-04 ENCOUNTER — Ambulatory Visit: Payer: Self-pay | Admitting: Medical

## 2017-07-16 ENCOUNTER — Ambulatory Visit: Payer: Self-pay | Admitting: Medical

## 2017-08-13 ENCOUNTER — Ambulatory Visit: Payer: Self-pay | Admitting: Medical

## 2020-02-20 ENCOUNTER — Other Ambulatory Visit: Payer: Self-pay

## 2020-02-20 ENCOUNTER — Ambulatory Visit: Payer: BC Managed Care – PPO | Admitting: Medical

## 2020-02-20 ENCOUNTER — Encounter: Payer: Self-pay | Admitting: Medical

## 2020-02-20 VITALS — BP 140/98 | HR 90 | Temp 98.1°F | Ht 74.0 in | Wt 203.4 lb

## 2020-02-20 DIAGNOSIS — Z0000 Encounter for general adult medical examination without abnormal findings: Secondary | ICD-10-CM

## 2020-02-20 DIAGNOSIS — Z8249 Family history of ischemic heart disease and other diseases of the circulatory system: Secondary | ICD-10-CM

## 2020-02-20 DIAGNOSIS — Z800 Family history of malignant neoplasm of digestive organs: Secondary | ICD-10-CM | POA: Diagnosis not present

## 2020-02-20 DIAGNOSIS — Z1159 Encounter for screening for other viral diseases: Secondary | ICD-10-CM | POA: Diagnosis not present

## 2020-02-20 DIAGNOSIS — E785 Hyperlipidemia, unspecified: Secondary | ICD-10-CM | POA: Diagnosis not present

## 2020-02-20 DIAGNOSIS — Z13220 Encounter for screening for lipoid disorders: Secondary | ICD-10-CM

## 2020-02-20 DIAGNOSIS — Z1329 Encounter for screening for other suspected endocrine disorder: Secondary | ICD-10-CM

## 2020-02-20 DIAGNOSIS — Z9889 Other specified postprocedural states: Secondary | ICD-10-CM

## 2020-02-20 DIAGNOSIS — Z125 Encounter for screening for malignant neoplasm of prostate: Secondary | ICD-10-CM

## 2020-02-20 DIAGNOSIS — K4020 Bilateral inguinal hernia, without obstruction or gangrene, not specified as recurrent: Secondary | ICD-10-CM | POA: Insufficient documentation

## 2020-02-20 NOTE — Patient Instructions (Signed)
Preventative Care for Adults - Male    Thank you for coming in for your well visit today, and thank you for trusting [place] with your care!   Maintain regular health and wellness exams:  A routine yearly physical is a good way to check in with your primary care provider about your health and preventive screening. It is also an opportunity to share updates about your health and any concerns you have, and receive a thorough all-over exam.   Most health insurance companies pay for at least some preventative services.  Check with your health plan for specific coverages.  What preventative services do men need?  Adult men should have their weight and blood pressure checked regularly.   Men age 60 and older should have their cholesterol levels checked regularly.  Beginning at age 60 and continuing to age 38, men should be screened for colorectal cancer.  Certain people may need continued testing until age 60.  Updating vaccinations is part of preventative care.  Vaccinations help protect against diseases such as the flu.  Osteoporosis is a disease in which the bones lose minerals and strength as we age. Men ages 60 and over should discuss this with their caregivers  Lab tests are generally done as part of preventative care to screen for anemia and blood disorders, to screen for problems with the kidneys and liver, to screen for bladder problems, to check blood sugar, and to check your cholesterol level.  Preventative services generally include counseling about diet, exercise, avoiding tobacco, drugs, excessive alcohol consumption, and sexually transmitted infections.   Xrays and CT scans are not normally done as a preventative test, and most insurances do not pay for imaging for screening other than as discussed under cancer screens below.   On the other hand, if you have certain medical concerns, imaging may be necessary as a diagnostic test.    Your Medical Team Your medical team starts with  [place], your PCP or primary care provider.  Please use our services for your routine care such as physicals, screenings, immunizations, sick visits, and your first stop for general medical concerns.  You can call our number for after hours information for urgent questions that may need attention but cannot wait til the next business day.    Urgent care-urgent cares exist to provide care when your primary care office would typically be closed such as evenings or weekends.   Urgent care is for evaluation of urgent medical problems that do not necessarily require emergency department care, but cannot wait til the next business day when we are open.  Emergency department care-please reserve emergency department care for serious, urgent, possibly life-threatening medical problems.  This includes issues like possible stroke, heart attack, significant injury, mental health crisis, or other urgent need that requires immediate medical attention.     See your dentist office twice yearly for hygiene and cleaning visits.   Brush your teeth and floss your teeth daily.  See your eye doctor yearly for routine eye exam and screenings for glaucoma and retinal disease.   Specific Concerns: We will refer you to general surgery for inguinal hernia   Vaccines:  Stay up to date with your tetanus shots and other required immunizations. You should have a booster for tetanus every 10 years. Be sure to get your flu shot every year, since 5%-20% of the [place] population comes down with the flu. The flu vaccine changes each year, so being vaccinated once is not enough. Get your shot  in the fall, before the flu season peaks.   Other vaccines to consider:  Pneumococcal vaccine to protect against certain types of pneumonia.  This is normally recommended for adults age 60 or older.  However, adults younger than 60 years old with certain underlying conditions such as diabetes, heart or lung disease should also receive the  vaccine.  Shingles vaccine to protect against Varicella Zoster if you are older than age 70, or younger than 60 years old with certain underlying illness.  If you have not had the Shingrix vaccine, please call your insurer to inquire about coverage for the Shingrix vaccine given in 2 doses.   Some insurers cover this vaccine after age 60, some cover this after age 60.  If your insurer covers this, then call to schedule appointment to have this vaccine here  Hepatitis A vaccine to protect against a form of infection of the liver by a virus acquired from food.  Hepatitis B vaccine to protect against a form of infection of the liver by a virus acquired from blood or body fluids, particularly if you work in health care.  If you plan to travel internationally, check with your local health department for specific vaccination recommendations.  Human Papilloma Virus or HPV causes cancer of the cervix, and other infections that can be transmitted from person to person. There is a vaccine for HPV, and males should get immunized between the ages of 60 and 60. It requires a series of 3 shots.   Covid/Coronavirus - as the vaccines are becoming available, please consider vaccination if you are a health care worker, first responder, or have significant health problems such as asthma, COPD, heart disease, hypertension, diabetes, obesity, multiple medical problems, over age 60yo, or immunocompromised.    You are up to date on Tetanus, Covid vaccines  I recommend the following vaccines: Shingrix shingles vaccine  Shingles vaccine:  I recommend you have a shingles vaccine to help prevent shingles or herpes zoster outbreak.   Please call your insurer to inquire about coverage for the Shingrix vaccine given in 2 doses.   Some insurers cover this vaccine after age 60, some cover this after age 60.  If your insurer covers this, then call to schedule appointment to have this vaccine here.     What should I know  about Cancer screening? Many types of cancers can be detected early and may often be prevented. Lung Cancer  You should be screened every year for lung cancer if: ? You are a current smoker who has smoked for at least 30 years. ? You are a former smoker who has quit within the past 15 years.  Talk to your health care provider about your screening options, when you should start screening, and how often you should be screened.  Colorectal Cancer  Routine colorectal cancer screening usually begins at 60 years of age and should be repeated every 5-10 years until you are 60 years old. You may need to be screened more often if early forms of precancerous polyps or small growths are found. Your health care provider may recommend screening at an earlier age if you have risk factors for colon cancer.  Your health care provider may recommend using home test kits to check for hidden blood in the stool.  A small camera at the end of a tube can be used to examine your colon (sigmoidoscopy or colonoscopy). This checks for the earliest forms of colorectal cancer.  Prostate and Testicular Cancer  Depending  on your age and overall health, your health care provider may do certain tests to screen for prostate and testicular cancer.  Talk to your health care provider about any symptoms or concerns you have about testicular or prostate cancer.  Skin Cancer  Check your skin from head to toe regularly.  Tell your health care provider about any new moles or changes in moles, especially if: ? There is a change in a mole's size, shape, or color. ? You have a mole that is larger than a pencil eraser.  Always use sunscreen. Apply sunscreen liberally and repeat throughout the day.  Protect yourself by wearing long sleeves, pants, a wide-brimmed hat, and sunglasses when outside.   Are you up to date on cancer screenings:  COLON CANCER SCREENING:  We will do stool cards now  PROSTATE CANCER SCREENING:  PSA  today   GENERAL RECOMMENDATIONS FOR GOOD HEALTH:  Healthy diet:  Eat a variety of foods, including fruit, vegetables, animal or vegetable protein, such as meat, fish, chicken, and eggs, or beans, lentils, tofu, and grains, such as rice.  Drink plenty of water daily.  Decrease saturated fat in the diet, avoid lots of red meat, processed foods, sweets, fast foods, and fried foods.  Exercise:  Aerobic exercise helps maintain good heart health. At least 30-40 minutes of moderate-intensity exercise is recommended. For example, a brisk walk that increases your heart rate and breathing. This should be done on most days of the week.   Find a type of exercise or a variety of exercises that you enjoy so that it becomes a part of your daily life.  Examples are running, walking, swimming, water aerobics, and biking.  For motivation and support, explore group exercise such as aerobic class, spin class, Zumba, Yoga,or  martial arts, etc.    Set exercise goals for yourself, such as a certain weight goal, walk or run in a race such as a 5k walk/run.  Speak to your primary care provider about exercise goals.  Your weight readings per our records: Wt Readings from Last 3 Encounters:  02/20/20 203 lb 6.4 oz (92.3 kg)  10/30/16 208 lb 9.6 oz (94.6 kg)  02/15/14 208 lb (94.3 kg)    Body mass index is 26.12 kg/m.    Disease prevention:  If you smoke or chew tobacco, find out from your caregiver how to quit. It can literally save your life, no matter how long you have been a tobacco user. If you do not use tobacco, never begin.   Maintain a healthy diet and normal weight. Increased weight leads to problems with blood pressure and diabetes.   The Body Mass Index or BMI is a way of measuring how much of your body is fat. Having a BMI above 27 increases the risk of heart disease, diabetes, hypertension, stroke and other problems related to obesity. Your caregiver can help determine your BMI and based on  it develop an exercise and dietary program to help you achieve or maintain this important measurement at a healthful level.  High blood pressure causes heart and blood vessel problems.  Persistent high blood pressure should be treated with medicine if weight loss and exercise do not work.  Your blood pressure readings per our records:     BP Readings from Last 3 Encounters:  02/20/20 (!) 140/98  10/30/16 128/84  02/15/14 124/90     Fat and cholesterol leaves deposits in your arteries that can block them. This causes heart disease and vessel  disease elsewhere in your body.  If your cholesterol is found to be high, or if you have heart disease or certain other medical conditions, then you may need to have your cholesterol monitored frequently and be treated with medication.   Ask if you should have a cardiac stress test if your history suggests this. A stress test is a test done on a treadmill that looks for heart disease. This test can find disease prior to there being a problem.    Heart disease screening:  2018 EKG up to date   Osteoporosis is a disease in which the bones lose minerals and strength as we age. This can result in serious bone fractures. Risk of osteoporosis can be identified using a bone density scan. Men ages 50 and over should discuss this with their caregivers. Ask your caregiver whether you should be taking a calcium supplement and Vitamin D, to reduce the rate of osteoporosis.   Avoid drinking alcohol in excess (more than two drinks per day).  Avoid use of street drugs. Do not share needles with anyone. Ask for professional help if you need assistance or instructions on stopping the use of alcohol, cigarettes, and/or drugs.  Brush your teeth twice a day with fluoride toothpaste, and floss once a day. Good oral hygiene prevents tooth decay and gum disease. The problems can be painful, unattractive, and can cause other health problems. Visit your dentist for a routine oral  and dental check up and preventive care every 6-12 months.   Safety:  Use seatbelts 100% of the time, whether driving or as a passenger.  Use safety devices such as hearing protection if you work in environments with loud noise or significant background noise.  Use safety glasses when doing any work that could send debris in to the eyes.  Use a helmet if you ride a bike or motorcycle.  Use appropriate safety gear for contact sports.  Talk to your caregiver about gun safety.  Use sunscreen with a SPF (or skin protection factor) of 15 or greater.  Lighter skinned people are at a greater risk of skin cancer. Don't forget to also wear sunglasses in order to protect your eyes from too much damaging sunlight. Damaging sunlight can accelerate cataract formation.   Keep carbon monoxide and smoke detectors in your home functioning at all times. Change the batteries every 6 months or use a model that plugs into the wall.    Sexual activity: . Sex is a normal part of life and sexual activity can continue into older adulthood for many healthy people.   . If you are having erectile dysfunction issues, please follow up to discuss this further.   . If you are not in a monogamous relationship or have more than one partner, please practice safe sex.  Use condoms. Condoms are used for birth control and to help reduce the spread of sexually transmitted infections (or STIs).  Some of the STIs are gonorrhea (the clap), chlamydia, syphilis, trichomonas, herpes, HPV (human papilloma virus) and HIV (human immunodeficiency virus) which causes AIDS. The herpes, HIV and HPV are viral illnesses that have no cure. These can result in disability, cancer and death.   We are able to test for STIs here at our office.

## 2020-02-20 NOTE — Progress Notes (Signed)
Subjective:   HPI  Devin Nunez is a 59 y.o. male who presents for Chief Complaint  Patient presents with  . Annual Exam    with fasting labs     Patient Care Team: Kersti Scavone, Leward Quan as PCP - General (Family Medicine) Sees dentist Sees eye doctor [facility], [place], [place] Dr. Juanita Craver, GI  Concerns: He notes about 6-8 weeks ago , felt enlargement in groin.  Would seem to enlarge, go away, then return. Feels bulge.   Has used Advil for sharp pains.  One evening had lots of pain in inguinal area, almost went to emergency dept.     Reviewed their medical, surgical, family, social, medication, and allergy history and updated chart as appropriate.  Past Medical History:  Diagnosis Date  . Elevated PSA 2008   hx/o with benign biopsy  . Hordeolum 2015   Dr. Katy Fitch, prior Doxycycline therapy  . Rosacea    takes Doxycycline every other day, uses facial creams;  [facility]  . Shingles 2008   chest/back  . Wisdom teeth removed     Past Surgical History:  Procedure Laterality Date  . COLONOSCOPY  04/2014   normal colonoscopy, Dr. Collene Mares  . PROSTATE BIOPSY  2008   reportedly benign    Social History   Socioeconomic History  . Marital status: Married    Spouse name: Not on file  . Number of children: Not on file  . Years of education: Not on file  . Highest education level: Not on file  Occupational History  . Not on file  Tobacco Use  . Smoking status: Never Smoker  . Smokeless tobacco: Never Used  Substance and Sexual Activity  . Alcohol use: Yes    Alcohol/week: 10.0 standard drinks    Types: 10 Cans of beer per week  . Drug use: No  . Sexual activity: Not on file  Other Topics Concern  . Not on file  Social History Narrative   Married, 2 children.  Moved from [place] to [place] 2012. Works for [employer], abrasives.  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  Exercise - walks often, 3.5 miles most days.   Stays active, stand up desk.  01/2020   Social Determinants of Health   Financial Resource Strain:   . Difficulty of Paying Living Expenses:   Food Insecurity:   . Worried About Charity fundraiser in the Last Year:   . Arboriculturist in the Last Year:   Transportation Needs:   . Film/video editor (Medical):   Marland Kitchen Lack of Transportation (Non-Medical):   Physical Activity:   . Days of Exercise per Week:   . Minutes of Exercise per Session:   Stress:   . Feeling of Stress :   Social Connections:   . Frequency of Communication with Friends and Family:   . Frequency of Social Gatherings with Friends and Family:   . Attends Religious Services:   . Active Member of Clubs or Organizations:   . Attends Archivist Meetings:   Marland Kitchen Marital Status:   Intimate Partner Violence:   . Fear of Current or Ex-Partner:   . Emotionally Abused:   Marland Kitchen Physically Abused:   . Sexually Abused:     Family History  Problem Relation Age of Onset  . Heart disease Father 23       MI, CABG  . Cancer Cousin 51       colon  . Diabetes Neg  Hx   . Hypertension Neg Hx   . Stroke Neg Hx      Current Outpatient Medications:  .  Doxycycline Hyclate 50 MG TABS, Take 1 tablet by mouth at bedtime., Disp: , Rfl:  .  aspirin EC 81 MG tablet, Take 1 tablet (81 mg total) by mouth daily. (Patient not taking: Reported on 02/20/2020), Disp: 90 tablet, Rfl: 3 .  atorvastatin (LIPITOR) 20 MG tablet, TAKE 1 TABLET BY MOUTH DAILY (Patient not taking: Reported on 02/20/2020), Disp: 90 tablet, Rfl: 0  No Known Allergies     Review of Systems Constitutional: -fever, -chills, -sweats, -unexpected weight change, -decreased appetite, -fatigue Allergy: -sneezing, -itching, -congestion Dermatology: -changing moles, --rash, -lumps ENT: -runny nose, -ear pain, -sore throat, -hoarseness, -sinus pain, -teeth pain, - ringing in ears, -hearing loss, -nosebleeds Cardiology: -chest pain, -palpitations, -swelling,  -difficulty breathing when lying flat, -waking up short of breath Respiratory: -cough, -shortness of breath, -difficulty breathing with exercise or exertion, -wheezing, -coughing up blood Gastroenterology: -abdominal pain, -nausea, -vomiting, -diarrhea, -constipation, -blood in stool, -changes in bowel movement, -difficulty swallowing or eating Hematology: -bleeding, -bruising  Musculoskeletal: -joint aches, -muscle aches, -joint swelling, -back pain, -neck pain, -cramping, -changes in gait Ophthalmology: denies vision changes, eye redness, itching, discharge Urology: -burning with urination, -difficulty urinating, -blood in urine, -urinary frequency, -urgency, -incontinence Neurology: -headache, -weakness, -tingling, -numbness, -memory loss, -falls, -dizziness Psychology: -depressed mood, -agitation, -sleep problems Male GU: no testicular mass, pain, no lymph nodes swollen, no swelling, no rash.     Objective:  BP (!) 140/98   Pulse 90   Temp 98.1 F (36.7 C)   Ht _0  (1.88 m)   Wt 203 lb 6.4 oz (92.3 kg)   SpO2 98%   BMI 26.12 kg/m   General appearance: alert, no distress, WD/WN, Caucasian male Skin: scattered macules, no worrisome lesions Neck: supple, no lymphadenopathy, no thyromegaly, no masses, normal ROM, no bruits Chest: non tender, normal shape and expansion Heart: RRR, normal S1, S2, no murmurs Lungs: CTA bilaterally, no wheezes, rhonchi, or rales Abdomen: +bs, soft, non tender, non distended, no masses, no hepatomegaly, no splenomegaly, no bruits Back: non tender, normal ROM, no scoliosis Musculoskeletal: upper extremities non tender, no obvious deformity, normal ROM throughout, lower extremities non tender, no obvious deformity, normal ROM throughout Extremities: no edema, no cyanosis, no clubbing Pulses: 2+ symmetric, upper and lower extremities, normal cap refill Neurological: alert, oriented x 3, CN2-12 intact, strength normal upper extremities and lower  extremities, sensation normal throughout, DTRs 2+ throughout, no cerebellar signs, gait normal Psychiatric: normal affect, behavior normal, pleasant  GU: normal male external genitalia,circumcised, nontender, no masses, mild to moderate reducible right inguinal hernia, likely small left inguinal hernia, no lymphadenopathy Rectal: anus normal tone, prostate WNL   Assessment and Plan :   Encounter Diagnoses  Name Primary?  . Encounter for health maintenance examination in adult Yes  . Family history of colon cancer   . Screening for prostate cancer   . Hyperlipidemia, unspecified hyperlipidemia type   . Family history of heart disease   . History of prostate biopsy   . Encounter for hepatitis C screening test for low risk patient   . Non-recurrent bilateral inguinal hernia without obstruction or gangrene   . Screening for lipid disorders   . Screening for thyroid disorder     Physical exam - discussed and counseled on healthy lifestyle, diet, exercise, preventative care, vaccinations, sick and well care, proper use of emergency dept and after  hours care, and addressed their concerns.    Health screening: See your eye doctor yearly for routine vision care. See your dentist yearly for routine dental care including hygiene visits twice yearly.  Cancer screening Colonoscopy:  Reviewed colonoscopy on file that is up to date Given stool cards kit to return for hemoccult screening  Discussed PSA, prostate exam, and prostate cancer screening risks/benefits.      Vaccinations: Advised yearly influenza vaccine  Up to date on Td and Covid vaccines  Shingles vaccine:  I recommend you have a shingles vaccine to help prevent shingles or herpes zoster outbreak.   Please call your insurer to inquire about coverage for the Shingrix vaccine given in 2 doses.   Some insurers cover this vaccine after age 54, some cover this after age 103.  If your insurer covers this, then call to schedule  appointment to have this vaccine here.    Acute issues discussed: Refer to general surgery for hernia repair   Aristotle was seen today for annual exam.  Diagnoses and all orders for this visit:  Encounter for health maintenance examination in adult -     Comprehensive metabolic panel -     CBC with Differential/Platelet -     TSH -     Lipid panel -     PSA -     Hepatitis C antibody -     Ambulatory referral to General Surgery  Family history of colon cancer  Screening for prostate cancer -     PSA  Hyperlipidemia, unspecified hyperlipidemia type  Family history of heart disease  History of prostate biopsy -     PSA  Encounter for hepatitis C screening test for low risk patient -     Hepatitis C antibody  Non-recurrent bilateral inguinal hernia without obstruction or gangrene -     Ambulatory referral to General Surgery  Screening for lipid disorders  Screening for thyroid disorder    Follow-up pending labs, yearly for physical

## 2020-02-21 LAB — HEPATITIS C ANTIBODY: Hep C Virus Ab: <0.1 s/co ratio (ref 0.0–0.9)

## 2020-02-21 LAB — CBC WITH DIFFERENTIAL/PLATELET
Basophils Absolute: 0 10*3/uL (ref 0.0–0.2)
Basos: 1 %
EOS (ABSOLUTE): 0.1 10*3/uL (ref 0.0–0.4)
Eos: 2 %
Hematocrit: 48.8 % (ref 37.5–51.0)
Hemoglobin: 16.8 g/dL (ref 13.0–17.7)
Immature Grans (Abs): 0 10*3/uL (ref 0.0–0.1)
Immature Granulocytes: 0 %
Lymphocytes Absolute: 1.6 10*3/uL (ref 0.7–3.1)
Lymphs: 27 %
MCH: 31.3 pg (ref 26.6–33.0)
MCHC: 34.4 g/dL (ref 31.5–35.7)
MCV: 91 fL (ref 79–97)
Monocytes Absolute: 0.6 10*3/uL (ref 0.1–0.9)
Monocytes: 11 %
Neutrophils Absolute: 3.5 10*3/uL (ref 1.4–7.0)
Neutrophils: 59 %
Platelets: 289 10*3/uL (ref 150–450)
RBC: 5.37 x10E6/uL (ref 4.14–5.80)
RDW: 11.8 % (ref 11.6–15.4)
WBC: 5.9 10*3/uL (ref 3.4–10.8)

## 2020-02-21 LAB — COMPREHENSIVE METABOLIC PANEL
ALT: 16 IU/L (ref 0–44)
AST: 24 IU/L (ref 0–40)
Albumin/Globulin Ratio: 1.7 (ref 1.2–2.2)
Albumin: 4.8 g/dL (ref 3.8–4.9)
Alkaline Phosphatase: 64 IU/L (ref 39–117)
BUN/Creatinine Ratio: 15 (ref 10–24)
BUN: 17 mg/dL (ref 8–27)
Bilirubin Total: 0.8 mg/dL (ref 0.0–1.2)
CO2: 21 mmol/L (ref 20–29)
Calcium: 10 mg/dL (ref 8.6–10.2)
Chloride: 101 mmol/L (ref 96–106)
Creatinine, Ser: 1.11 mg/dL (ref 0.76–1.27)
GFR calc Af Amer: 83 mL/min/{1.73_m2} (ref >59)
GFR calc non Af Amer: 72 mL/min/{1.73_m2} (ref >59)
Globulin, Total: 2.8 g/dL (ref 1.5–4.5)
Glucose: 101 mg/dL — ABNORMAL HIGH (ref 65–99)
Potassium: 4.5 mmol/L (ref 3.5–5.2)
Sodium: 138 mmol/L (ref 134–144)
Total Protein: 7.6 g/dL (ref 6.0–8.5)

## 2020-02-21 LAB — LIPID PANEL
Chol/HDL Ratio: 3.9 ratio (ref 0.0–5.0)
Cholesterol, Total: 289 mg/dL — ABNORMAL HIGH (ref 100–199)
HDL: 74 mg/dL (ref >39)
LDL Chol Calc (NIH): 183 mg/dL — ABNORMAL HIGH (ref 0–99)
Triglycerides: 174 mg/dL — ABNORMAL HIGH (ref 0–149)
VLDL Cholesterol Cal: 32 mg/dL (ref 5–40)

## 2020-02-21 LAB — TSH: TSH: 1.69 u[IU]/mL (ref 0.450–4.500)

## 2020-02-21 LAB — PSA: Prostate Specific Ag, Serum: 3.1 ng/mL (ref 0.0–4.0)

## 2020-03-21 DIAGNOSIS — K4090 Unilateral inguinal hernia, without obstruction or gangrene, not specified as recurrent: Secondary | ICD-10-CM | POA: Diagnosis not present

## 2020-03-21 DIAGNOSIS — R1032 Left lower quadrant pain: Secondary | ICD-10-CM | POA: Diagnosis not present

## 2020-03-26 ENCOUNTER — Other Ambulatory Visit: Payer: Self-pay | Admitting: General Surgery

## 2020-03-26 DIAGNOSIS — R1032 Left lower quadrant pain: Secondary | ICD-10-CM

## 2020-04-11 ENCOUNTER — Other Ambulatory Visit: Payer: Self-pay

## 2020-04-11 ENCOUNTER — Ambulatory Visit
Admission: RE | Admit: 2020-04-11 | Discharge: 2020-04-11 | Disposition: A | Discharge status: Home / Self Care | Payer: BC Managed Care – PPO | Source: Ambulatory Visit | Attending: General Surgery | Admitting: General Surgery

## 2020-04-11 DIAGNOSIS — K4090 Unilateral inguinal hernia, without obstruction or gangrene, not specified as recurrent: Secondary | ICD-10-CM | POA: Diagnosis not present

## 2020-04-11 DIAGNOSIS — R1032 Left lower quadrant pain: Secondary | ICD-10-CM

## 2020-04-19 ENCOUNTER — Ambulatory Visit: Payer: Self-pay | Admitting: General Surgery

## 2020-06-11 ENCOUNTER — Other Ambulatory Visit (HOSPITAL_COMMUNITY)
Admission: RE | Admit: 2020-06-11 | Discharge: 2020-06-11 | Disposition: A | Discharge status: Home / Self Care | Payer: BC Managed Care – PPO | Source: Ambulatory Visit | Attending: General Surgery | Admitting: General Surgery

## 2020-06-11 DIAGNOSIS — Z01812 Encounter for preprocedural laboratory examination: Secondary | ICD-10-CM | POA: Diagnosis not present

## 2020-06-11 DIAGNOSIS — Z20822 Contact with and (suspected) exposure to covid-19: Secondary | ICD-10-CM | POA: Diagnosis not present

## 2020-06-11 LAB — SARS CORONAVIRUS 2 (TAT 6-24 HRS): SARS Coronavirus 2: NEGATIVE

## 2020-06-11 NOTE — Progress Notes (Signed)
[id number] [place], [place] - [address] [address] Phone: [phone] Fax: [phone]      Your procedure is scheduled on 06/14/20.  Report to [facility] Main Entrance "A" at 6:30 A.M., and check in at the Admitting office.  Call this number if you have problems the morning of surgery:  [phone]  Call [phone] if you have any questions prior to your surgery date Monday-Friday 8am-4pm    Remember:  Do not eat after midnight the night before your surgery  You may drink clear liquids until 5:30 the morning of your surgery.   Clear liquids allowed are: Water, Non-Citrus Juices (without pulp), Carbonated Beverages, Clear Tea, Black Coffee Only, and Gatorade    Take these medicines the morning of surgery with A SIP OF WATER: Doxycycline Hyclate  As of today, STOP taking any Aspirin (unless otherwise instructed by your surgeon) Aleve, Naproxen, Ibuprofen, Motrin, Advil, Goody's, BC's, all herbal medications, fish oil, and all vitamins.                      Do not wear jewelry, make up, or nail polish            Do not wear lotions, powders, colognes, or deodorant.            Do not shave 48 hours prior to surgery.  Men may shave face and neck.            Do not bring valuables to the hospital.            [facility] is not responsible for any belongings or valuables.  Do NOT Smoke (Tobacco/Vaping) or drink Alcohol 24 hours prior to your procedure If you use a CPAP at night, you may bring all equipment for your overnight stay.   Contacts, glasses, dentures or bridgework may not be worn into surgery.      For patients admitted to the hospital, discharge time will be determined by your treatment team.   Patients discharged the day of surgery will not be allowed to drive home, and someone needs to stay with them for 24 hours.    Special instructions:   [place]- Preparing For Surgery  Before surgery, you can play an  important role. Because skin is not sterile, your skin needs to be as free of germs as possible. You can reduce the number of germs on your skin by washing with CHG (chlorahexidine gluconate) Soap before surgery.  CHG is an antiseptic cleaner which kills germs and bonds with the skin to continue killing germs even after washing.    Oral Hygiene is also important to reduce your risk of infection.  Remember - BRUSH YOUR TEETH THE MORNING OF SURGERY WITH YOUR REGULAR TOOTHPASTE  Please do not use if you have an allergy to CHG or antibacterial soaps. If your skin becomes reddened/irritated stop using the CHG.  Do not shave (including legs and underarms) for at least 48 hours prior to first CHG shower. It is OK to shave your face.  Please follow these instructions carefully.   1. Shower the NIGHT BEFORE SURGERY and the MORNING OF SURGERY with CHG Soap.   2. If you chose to wash your hair, wash your hair first as usual with your normal shampoo.  3. After you shampoo, rinse your hair and body thoroughly to remove the shampoo.  4. Use CHG as you would any other liquid soap. You can  apply CHG directly to the skin and wash gently with a scrungie or a clean washcloth.   5. Apply the CHG Soap to your body ONLY FROM THE NECK DOWN.  Do not use on open wounds or open sores. Avoid contact with your eyes, ears, mouth and genitals (private parts). Wash Face and genitals (private parts)  with your normal soap.   6. Wash thoroughly, paying special attention to the area where your surgery will be performed.  7. Thoroughly rinse your body with warm water from the neck down.  8. DO NOT shower/wash with your normal soap after using and rinsing off the CHG Soap.  9. Pat yourself dry with a CLEAN TOWEL.  10. Wear CLEAN PAJAMAS to bed the night before surgery  11. Place CLEAN SHEETS on your bed the night of your first shower and DO NOT SLEEP WITH PETS.   Day of Surgery: Wear Clean/Comfortable clothing the  morning of surgery Do not apply any deodorants/lotions.   Remember to brush your teeth WITH YOUR REGULAR TOOTHPASTE.   Please read over the following fact sheets that you were given.

## 2020-06-12 ENCOUNTER — Encounter (HOSPITAL_COMMUNITY): Payer: Self-pay

## 2020-06-12 ENCOUNTER — Other Ambulatory Visit: Payer: Self-pay

## 2020-06-12 ENCOUNTER — Encounter (HOSPITAL_COMMUNITY)
Admission: RE | Admit: 2020-06-12 | Discharge: 2020-06-12 | Disposition: A | Discharge status: Home / Self Care | Payer: BC Managed Care – PPO | Source: Ambulatory Visit | Attending: General Surgery | Admitting: General Surgery

## 2020-06-12 DIAGNOSIS — Z01818 Encounter for other preprocedural examination: Secondary | ICD-10-CM | POA: Insufficient documentation

## 2020-06-12 HISTORY — DX: Ventricular premature depolarization: I49.3

## 2020-06-12 LAB — CBC
HCT: 47.2 % (ref 39.0–52.0)
Hemoglobin: 15.3 g/dL (ref 13.0–17.0)
MCH: 31.4 pg (ref 26.0–34.0)
MCHC: 32.4 g/dL (ref 30.0–36.0)
MCV: 96.9 fL (ref 80.0–100.0)
Platelets: 246 10*3/uL (ref 150–400)
RBC: 4.87 MIL/uL (ref 4.22–5.81)
RDW: 12.3 % (ref 11.5–15.5)
WBC: 5 10*3/uL (ref 4.0–10.5)
nRBC: 0 % (ref 0.0–0.2)

## 2020-06-12 LAB — COMPREHENSIVE METABOLIC PANEL
ALT: 22 U/L (ref 0–44)
AST: 22 U/L (ref 15–41)
Albumin: 4 g/dL (ref 3.5–5.0)
Alkaline Phosphatase: 47 U/L (ref 38–126)
Anion gap: 10 (ref 5–15)
BUN: 9 mg/dL (ref 6–20)
CO2: 23 mmol/L (ref 22–32)
Calcium: 9.4 mg/dL (ref 8.9–10.3)
Chloride: 108 mmol/L (ref 98–111)
Creatinine, Ser: 1.01 mg/dL (ref 0.61–1.24)
GFR calc Af Amer: >60 mL/min (ref >60)
GFR calc non Af Amer: >60 mL/min (ref >60)
Glucose, Bld: 93 mg/dL (ref 70–99)
Potassium: 4 mmol/L (ref 3.5–5.1)
Sodium: 141 mmol/L (ref 135–145)
Total Bilirubin: 0.5 mg/dL (ref 0.3–1.2)
Total Protein: 7.2 g/dL (ref 6.5–8.1)

## 2020-06-12 NOTE — Progress Notes (Addendum)
Anesthesia PAT Evaluation:  Case: [id number] Date/Time: 06/14/20 0815   Procedure: HERNIA REPAIR INGUINAL ADULT BILATERAL WITH MESH (Bilateral ) - GENERAL AND TAP BLOCK   Anesthesia type: General   Pre-op diagnosis: BILATERAL INGUINAL HERNIA   [facility] ROOM 02 / [facility] OR   Surgeons: Griselda Miner, MD      DISCUSSION: Patient is a 59 year old male scheduled for the above procedure.  History includes never smoker, rosacea, PVCs (2015). Elevated BP without formal HTN diagnosis.  I elevated patient during his PAT visit due to elevated BP. He has never been diagnosed with HTN--historically no issues (128/84 11/09/16, 122/83 09/15/18), although 140/98 on 02/20/20 when last evaluated by his PCP. However, on arrival to PAT BP 163/114 (automated). He drank two cups of coffee prior to his PAT visit, but otherwise had not eaten and denied smoking or current decongestant use. He denied pain or anxiety. He had additional automated BP checks after RN interview 148/109, after labs 163/114, after EKG 160/89. Following my evaluation, I took a RUE manual BP with reading of 146/96. Caffeine possibly contributing to his initial elevated readings, although DBP was 98 even on 02/20/20. Although last PAT readings of 160/89 and 146/96 would be acceptable for OR, I advised that I cannot predict his day of surgery BP, and should his SBP/DBP be significantly elevated then it it possible his surgery could be cancelled. To minimize risk of cancellation, I advised BP recheck at his PCP office prior to surgery so if higher then primary care could consider adding medication prior to surgery. Other then his elevated readings, he otherwise had no concerning findings. He denied chest pain, SOB, edema, syncope. He is no longer having palpitations that were noted in 2015 with PVCs. He walks about 4 miles on a regular basis and spends 30-45 minutes of that time getting to his target HR. No CV symptoms with this level of exercise. EKG done  due to his elevated BP readings, and this showed NSR. His labs are are WNL. I did not appreciate a murmur on exam. Lungs were clear. I sent a staff message to Dr. Carolynne Edouard with summary of BP readings. (uPDATE 06/13/20 9:04 AM: No staff message response from Dr. Carolynne Edouard, so I spoke with [facility] triage nurse Toniann Fail about PAT BP trends.)  Preoperative COVID-19 test negative on 06/11/20. He will get vitals on arrival for surgery, if BP reasonable then would anticipate that he can proceed as scheduled.    VS: BP (!) 146/96 Comment: RUE, manual cuff  Pulse 85   Temp (!) 36.4 C   Resp 18   Ht 6\' 1"  (1.854 m)   Wt 95.7 kg   SpO2 98%   BMI 27.82 kg/m  BP 163/114 on arrival, 148/109 after RN interview, 163/114 after labs, 160/89 after EKG. 146/96 manual, RUE after APP evaluation just before leaving PAT.     PROVIDERS: Tysinger, , PA-C is PCP  - He saw cardiologist Kermit Balo, MD for PVCs. Echo was done, but otherwise patient reassured.    LABS: Labs reviewed: Acceptable for surgery. (all labs ordered are listed, but only abnormal results are displayed)  Labs Reviewed  CBC  COMPREHENSIVE METABOLIC PANEL    IMAGES: CT pelvis 04/11/20: IMPRESSION: Small bilateral fat containing inguinal hernias. No other abnormality seen in the pelvis.   EKG: 06/12/20: NSR   CV: Echo 03/21/14 [facility], scanned under Media tab): Conclusion: 1. Normal LVEF 55%. 2. Mild to moderate MR. 3. Slight LA enlargement.  Past Medical History:  Diagnosis Date  . Elevated PSA 2008   hx/o with benign biopsy  . Hordeolum 2015   Dr. Dione Booze, prior Doxycycline therapy  . PVC (premature ventricular contraction)   . Rosacea    takes Doxycycline every other day, uses facial creams;  [facility]  . Shingles 2008   chest/back  . Wisdom teeth removed     Past Surgical History:  Procedure Laterality Date  . COLONOSCOPY  04/2014   normal colonoscopy, Dr. Loreta Ave  . PROSTATE BIOPSY  2008    reportedly benign  . WISDOM TOOTH EXTRACTION      MEDICATIONS: . aspirin EC 81 MG tablet  . atorvastatin (LIPITOR) 20 MG tablet  . Doxycycline Hyclate 50 MG TABS   No current facility-administered medications for this encounter.  He is not currently taking ASA or Lipitor.   Shonna Chock, PA-C Surgical Short Stay/Anesthesiology [facility] Phone [phone] [facility] Phone [phone] 06/12/2020 12:52 PM

## 2020-06-12 NOTE — Anesthesia Preprocedure Evaluation (Addendum)
Anesthesia Evaluation  Patient identified by MRN, date of birth, ID band Patient awake    Reviewed: Allergy & Precautions, NPO status , Patient's Chart, lab work & pertinent test results  Airway Mallampati: II  TM Distance: >3 FB Neck ROM: Full    Dental no notable dental hx.    Pulmonary neg pulmonary ROS,    Pulmonary exam normal breath sounds clear to auscultation       Cardiovascular negative cardio ROS Normal cardiovascular exam Rhythm:Regular Rate:Normal     Neuro/Psych negative neurological ROS  negative psych ROS   GI/Hepatic negative GI ROS, Neg liver ROS,   Endo/Other  negative endocrine ROS  Renal/GU negative Renal ROS  negative genitourinary   Musculoskeletal negative musculoskeletal ROS (+)   Abdominal   Peds negative pediatric ROS (+)  Hematology negative hematology ROS (+)   Anesthesia Other Findings   Reproductive/Obstetrics negative OB ROS                             Anesthesia Physical Anesthesia Plan  ASA: II  Anesthesia Plan: General   Post-op Pain Management:  Regional for Post-op pain   Induction: Intravenous  PONV Risk Score and Plan: 2 and Ondansetron, Midazolam and Treatment may vary due to age or medical condition  Airway Management Planned: Oral ETT  Additional Equipment:   Intra-op Plan:   Post-operative Plan: Extubation in OR  Informed Consent: I have reviewed the patients History and Physical, chart, labs and discussed the procedure including the risks, benefits and alternatives for the proposed anesthesia with the patient or authorized representative who has indicated his/her understanding and acceptance.     Dental advisory given  Plan Discussed with: CRNA  Anesthesia Plan Comments: (PAT note written 06/12/2020 by Shonna Chock, PA-C. )       Anesthesia Quick Evaluation

## 2020-06-12 NOTE — Progress Notes (Addendum)
PCP - Crosby Oyster Cardiologist - [phone] saw Dr Donnie Aho for PVCs  Chest x-ray - not needed EKG - not needed Stress Test - denies ECHO - denies Cardiac Cath - denies  Blood Thinner Instructions: n/a Aspirin Instructions:n/a  ERAS Protcol - clears until 0530  PRE-SURGERY Ensure or G2- not ordered  COVID TEST-  Yesterday 06/11/20 negative   Anesthesia review: yes, elevated blood pressure  Patient denies shortness of breath, fever, cough and chest pain at PAT appointment   All instructions explained to the patient, with a verbal understanding of the material. Patient agrees to go over the instructions while at home for a better understanding. Patient also instructed to self quarantine after being tested for COVID-19. The opportunity to ask questions was provided.

## 2020-06-14 ENCOUNTER — Ambulatory Visit (HOSPITAL_COMMUNITY)
Admission: RE | Admit: 2020-06-14 | Discharge: 2020-06-14 | Disposition: A | Discharge status: Home / Self Care | Payer: BC Managed Care – PPO | Attending: General Surgery | Admitting: General Surgery

## 2020-06-14 ENCOUNTER — Other Ambulatory Visit: Payer: Self-pay

## 2020-06-14 ENCOUNTER — Encounter (HOSPITAL_COMMUNITY): Payer: Self-pay | Admitting: General Surgery

## 2020-06-14 ENCOUNTER — Ambulatory Visit (HOSPITAL_COMMUNITY): Payer: BC Managed Care – PPO | Admitting: Vascular Surgery

## 2020-06-14 ENCOUNTER — Encounter (HOSPITAL_COMMUNITY)
Admission: RE | Disposition: A | Discharge status: Home / Self Care | Payer: Self-pay | Source: Home / Self Care | Attending: General Surgery

## 2020-06-14 ENCOUNTER — Ambulatory Visit (HOSPITAL_COMMUNITY): Payer: BC Managed Care – PPO | Admitting: Anesthesiology

## 2020-06-14 DIAGNOSIS — G8918 Other acute postprocedural pain: Secondary | ICD-10-CM | POA: Diagnosis not present

## 2020-06-14 DIAGNOSIS — K4090 Unilateral inguinal hernia, without obstruction or gangrene, not specified as recurrent: Secondary | ICD-10-CM | POA: Insufficient documentation

## 2020-06-14 DIAGNOSIS — E785 Hyperlipidemia, unspecified: Secondary | ICD-10-CM | POA: Diagnosis not present

## 2020-06-14 DIAGNOSIS — K4020 Bilateral inguinal hernia, without obstruction or gangrene, not specified as recurrent: Secondary | ICD-10-CM | POA: Diagnosis not present

## 2020-06-14 HISTORY — PX: INGUINAL HERNIA REPAIR: SHX194

## 2020-06-14 SURGERY — REPAIR, HERNIA, INGUINAL, BILATERAL, ADULT
Anesthesia: General | Site: Abdomen | Laterality: Right

## 2020-06-14 MED ORDER — FENTANYL CITRATE (PF) 250 MCG/5ML IJ SOLN
INTRAMUSCULAR | Status: AC
  Filled 2020-06-14: qty 5

## 2020-06-14 MED ORDER — CHLORHEXIDINE GLUCONATE CLOTH 2 % EX PADS: 6.0000 | MEDICATED_PAD | Freq: Once | CUTANEOUS | Status: DC

## 2020-06-14 MED ORDER — FENTANYL CITRATE (PF) 100 MCG/2ML IJ SOLN
INTRAMUSCULAR | Status: AC
  Administered 2020-06-14: 100 ug via INTRAVENOUS
  Filled 2020-06-14: qty 2

## 2020-06-14 MED ORDER — HYDROMORPHONE HCL 1 MG/ML IJ SOLN: 0.2500 mg | INTRAMUSCULAR | Status: DC | PRN

## 2020-06-14 MED ORDER — STERILE WATER FOR IRRIGATION IR SOLN
Status: DC | PRN
  Administered 2020-06-14: 1000 mL

## 2020-06-14 MED ORDER — LABETALOL HCL 5 MG/ML IV SOLN
10.0000 mg | INTRAVENOUS | Status: DC | PRN
  Administered 2020-06-14: 10 mg via INTRAVENOUS

## 2020-06-14 MED ORDER — GABAPENTIN 300 MG PO CAPS: 300.0000 mg | ORAL_CAPSULE | ORAL | Status: AC

## 2020-06-14 MED ORDER — BUPIVACAINE-EPINEPHRINE 0.25% -1:200000 IJ SOLN: INTRAMUSCULAR | Status: DC | PRN

## 2020-06-14 MED ORDER — PROPOFOL 10 MG/ML IV BOLUS
INTRAVENOUS | Status: DC | PRN
  Administered 2020-06-14: 150 mg via INTRAVENOUS

## 2020-06-14 MED ORDER — 0.9 % SODIUM CHLORIDE (POUR BTL) OPTIME
TOPICAL | Status: DC | PRN
  Administered 2020-06-14: 1000 mL

## 2020-06-14 MED ORDER — BUPIVACAINE HCL (PF) 0.25 % IJ SOLN
INTRAMUSCULAR | Status: AC
  Filled 2020-06-14: qty 30

## 2020-06-14 MED ORDER — GABAPENTIN 300 MG PO CAPS
ORAL_CAPSULE | ORAL | Status: AC
  Administered 2020-06-14: 300 mg via ORAL
  Filled 2020-06-14: qty 1

## 2020-06-14 MED ORDER — MIDAZOLAM HCL 2 MG/2ML IJ SOLN
INTRAMUSCULAR | Status: AC
  Administered 2020-06-14: 2 mg via INTRAVENOUS
  Filled 2020-06-14: qty 2

## 2020-06-14 MED ORDER — CELECOXIB 200 MG PO CAPS
ORAL_CAPSULE | ORAL | Status: AC
  Administered 2020-06-14: 200 mg via ORAL
  Filled 2020-06-14: qty 1

## 2020-06-14 MED ORDER — MIDAZOLAM HCL 2 MG/2ML IJ SOLN: 2.0000 mg | Freq: Once | INTRAMUSCULAR | Status: AC

## 2020-06-14 MED ORDER — LACTATED RINGERS IV SOLN: INTRAVENOUS | Status: DC

## 2020-06-14 MED ORDER — ACETAMINOPHEN 500 MG PO TABS
ORAL_TABLET | ORAL | Status: AC
  Administered 2020-06-14: 1000 mg via ORAL
  Filled 2020-06-14: qty 2

## 2020-06-14 MED ORDER — BUPIVACAINE HCL 0.25 % IJ SOLN
INTRAMUSCULAR | Status: DC | PRN
  Administered 2020-06-14: 20 mL

## 2020-06-14 MED ORDER — EPHEDRINE SULFATE-NACL 50-0.9 MG/10ML-% IV SOSY
PREFILLED_SYRINGE | INTRAVENOUS | Status: DC | PRN
  Administered 2020-06-14 (×3): 10 mg via INTRAVENOUS

## 2020-06-14 MED ORDER — OXYCODONE HCL 5 MG PO TABS: 5.0000 mg | ORAL_TABLET | Freq: Once | ORAL | Status: DC | PRN

## 2020-06-14 MED ORDER — CEFAZOLIN SODIUM-DEXTROSE 2-4 GM/100ML-% IV SOLN
2.0000 g | INTRAVENOUS | Status: AC
  Administered 2020-06-14: 2 g via INTRAVENOUS

## 2020-06-14 MED ORDER — MEPERIDINE HCL 25 MG/ML IJ SOLN: 6.2500 mg | INTRAMUSCULAR | Status: DC | PRN

## 2020-06-14 MED ORDER — OXYCODONE HCL 5 MG/5ML PO SOLN: 5.0000 mg | Freq: Once | ORAL | Status: DC | PRN

## 2020-06-14 MED ORDER — PROMETHAZINE HCL 25 MG/ML IJ SOLN: 6.2500 mg | INTRAMUSCULAR | Status: DC | PRN

## 2020-06-14 MED ORDER — FENTANYL CITRATE (PF) 250 MCG/5ML IJ SOLN
INTRAMUSCULAR | Status: DC | PRN
  Administered 2020-06-14 (×2): 50 ug via INTRAVENOUS
  Administered 2020-06-14: 100 ug via INTRAVENOUS

## 2020-06-14 MED ORDER — AMISULPRIDE (ANTIEMETIC) 5 MG/2ML IV SOLN: 10.0000 mg | Freq: Once | INTRAVENOUS | Status: DC | PRN

## 2020-06-14 MED ORDER — FENTANYL CITRATE (PF) 100 MCG/2ML IJ SOLN: 100.0000 ug | Freq: Once | INTRAMUSCULAR | Status: AC

## 2020-06-14 MED ORDER — SUGAMMADEX SODIUM 200 MG/2ML IV SOLN
INTRAVENOUS | Status: DC | PRN
  Administered 2020-06-14: 200 mg via INTRAVENOUS

## 2020-06-14 MED ORDER — ONDANSETRON HCL 4 MG/2ML IJ SOLN
INTRAMUSCULAR | Status: DC | PRN
  Administered 2020-06-14: 4 mg via INTRAVENOUS

## 2020-06-14 MED ORDER — CHLORHEXIDINE GLUCONATE 0.12 % MT SOLN
OROMUCOSAL | Status: AC
  Administered 2020-06-14: 15 mL via OROMUCOSAL
  Filled 2020-06-14: qty 15

## 2020-06-14 MED ORDER — ACETAMINOPHEN 500 MG PO TABS: 1000.0000 mg | ORAL_TABLET | ORAL | Status: AC

## 2020-06-14 MED ORDER — CHLORHEXIDINE GLUCONATE 0.12 % MT SOLN: 15.0000 mL | Freq: Once | OROMUCOSAL | Status: AC

## 2020-06-14 MED ORDER — LIDOCAINE 2% (20 MG/ML) 5 ML SYRINGE
INTRAMUSCULAR | Status: DC | PRN
  Administered 2020-06-14 (×2): 20 mg via INTRAVENOUS

## 2020-06-14 MED ORDER — ROCURONIUM BROMIDE 10 MG/ML (PF) SYRINGE
PREFILLED_SYRINGE | INTRAVENOUS | Status: DC | PRN
  Administered 2020-06-14: 100 mg via INTRAVENOUS

## 2020-06-14 MED ORDER — ROPIVACAINE HCL 5 MG/ML IJ SOLN
INTRAMUSCULAR | Status: DC | PRN
  Administered 2020-06-14: 30 mL via PERINEURAL

## 2020-06-14 MED ORDER — PROPOFOL 10 MG/ML IV BOLUS
INTRAVENOUS | Status: AC
  Filled 2020-06-14: qty 20

## 2020-06-14 MED ORDER — ORAL CARE MOUTH RINSE: 15.0000 mL | Freq: Once | OROMUCOSAL | Status: AC

## 2020-06-14 MED ORDER — LABETALOL HCL 5 MG/ML IV SOLN
INTRAVENOUS | Status: AC
  Filled 2020-06-14: qty 4

## 2020-06-14 MED ORDER — HYDROCODONE-ACETAMINOPHEN 5-325 MG PO TABS: 1.0000 | ORAL_TABLET | Freq: Four times a day (QID) | ORAL | 0 refills | Status: DC | PRN

## 2020-06-14 MED ORDER — CELECOXIB 200 MG PO CAPS: 200.0000 mg | ORAL_CAPSULE | ORAL | Status: AC

## 2020-06-14 MED ORDER — CEFAZOLIN SODIUM-DEXTROSE 2-4 GM/100ML-% IV SOLN
INTRAVENOUS | Status: AC
  Filled 2020-06-14: qty 100

## 2020-06-14 SURGICAL SUPPLY — 31 items
BLADE CLIPPER SURG (BLADE) ×2 IMPLANT
CHLORAPREP W/TINT 26 (MISCELLANEOUS) ×2 IMPLANT
COVER SURGICAL LIGHT HANDLE (MISCELLANEOUS) ×2 IMPLANT
COVER WAND RF STERILE (DRAPES) IMPLANT
DECANTER SPIKE VIAL GLASS SM (MISCELLANEOUS) ×2 IMPLANT
DERMABOND ADHESIVE PROPEN (GAUZE/BANDAGES/DRESSINGS) ×1
DERMABOND ADVANCED (GAUZE/BANDAGES/DRESSINGS) ×1
DERMABOND ADVANCED .7 DNX12 (GAUZE/BANDAGES/DRESSINGS) ×1 IMPLANT
DERMABOND ADVANCED .7 DNX6 (GAUZE/BANDAGES/DRESSINGS) ×1 IMPLANT
DRAIN PENROSE 1/2X12 LTX STRL (WOUND CARE) ×2 IMPLANT
DRAPE LAPAROSCOPIC ABDOMINAL (DRAPES) ×2 IMPLANT
ELECT REM PT RETURN 9FT ADLT (ELECTROSURGICAL) ×2
ELECTRODE REM PT RTRN 9FT ADLT (ELECTROSURGICAL) ×1 IMPLANT
GLOVE BIO SURGEON STRL SZ7.5 (GLOVE) ×2 IMPLANT
GOWN STRL REUS W/ TWL LRG LVL3 (GOWN DISPOSABLE) ×2 IMPLANT
GOWN STRL REUS W/TWL LRG LVL3 (GOWN DISPOSABLE) ×2
KIT BASIN OR (CUSTOM PROCEDURE TRAY) ×2 IMPLANT
KIT TURNOVER KIT B (KITS) ×2 IMPLANT
MESH ULTRAPRO 3X6 7.6X15CM (Mesh General) ×2 IMPLANT
NEEDLE HYPO 25GX1X1/2 BEV (NEEDLE) ×2 IMPLANT
NS IRRIG 1000ML POUR BTL (IV SOLUTION) ×2 IMPLANT
PACK GENERAL/GYN (CUSTOM PROCEDURE TRAY) ×2 IMPLANT
PAD ARMBOARD 7.5X6 YLW CONV (MISCELLANEOUS) ×2 IMPLANT
PENCIL SMOKE EVACUATOR (MISCELLANEOUS) ×2 IMPLANT
SUT PROLENE 2 0 SH DA (SUTURE) ×4 IMPLANT
SUT SILK 2 0 SH (SUTURE) ×2 IMPLANT
SUT VIC AB 3-0 SH 27 (SUTURE) ×2
SUT VIC AB 3-0 SH 27XBRD (SUTURE) ×2 IMPLANT
SYR CONTROL 10ML LL (SYRINGE) ×2 IMPLANT
TOWEL GREEN STERILE (TOWEL DISPOSABLE) ×2 IMPLANT
TOWEL GREEN STERILE FF (TOWEL DISPOSABLE) ×2 IMPLANT

## 2020-06-14 NOTE — Interval H&P Note (Signed)
History and Physical Interval Note:  06/14/2020 8:19 AM  Devin Nunez  has presented today for surgery, with the diagnosis of right INGUINAL HERNIA.  The various methods of treatment have been discussed with the patient and family. After consideration of risks, benefits and other options for treatment, the patient has consented to  Procedure(s) with comments: HERNIA REPAIR INGUINAL ADULT right WITH MESH (Bilateral) - GENERAL AND TAP BLOCK as a surgical intervention.  The patient's history has been reviewed, patient examined, no change in status, stable for surgery.  I have reviewed the patient's chart and labs.  Questions were answered to the patient's satisfaction.     Chevis Pretty III

## 2020-06-14 NOTE — Anesthesia Procedure Notes (Signed)
Anesthesia Regional Block: TAP block   Pre-Anesthetic Checklist: ,, timeout performed, Correct Patient, Correct Site, Correct Laterality, Correct Procedure, Correct Position, site marked, Risks and benefits discussed,  Surgical consent,  Pre-op evaluation,  At surgeon's request and post-op pain management  Laterality: Right  Prep: chloraprep       Needles:  Injection technique: Single-shot      Additional Needles:   Procedures:,,,, ultrasound used (permanent image in chart),,,,  Narrative:  Start time: 06/14/2020 8:38 AM End time: 06/14/2020 8:43 AM Injection made incrementally with aspirations every 5 mL.  Performed by: Personally  Anesthesiologist: Lowella Curb, MD

## 2020-06-14 NOTE — Transfer of Care (Signed)
Immediate Anesthesia Transfer of Care Note  Patient: Devin Nunez  Procedure(s) Performed: HERNIA REPAIR INGUINAL ADULT RIGHT WITH MESH (Right Abdomen)  Patient Location: PACU  Anesthesia Type:GA combined with regional for post-op pain  Level of Consciousness: awake, alert  and patient cooperative  Airway & Oxygen Therapy: Patient Spontanous Breathing  Post-op Assessment: Report given to RN and Post -op Vital signs reviewed and stable  Post vital signs: Reviewed and stable  Last Vitals:  Vitals Value Taken Time  BP 170/108 06/14/20 1025  Temp    Pulse 86 06/14/20 1025  Resp 10 06/14/20 1025  SpO2 98 % 06/14/20 1025  Vitals shown include unvalidated device data.  Last Pain:  Vitals:   06/14/20 0708  TempSrc: Temporal  PainSc:       Patients Stated Pain Goal: 3 (06/14/20 0654)  Complications: No complications documented.

## 2020-06-14 NOTE — Anesthesia Procedure Notes (Signed)
Procedure Name: Intubation Date/Time: 06/14/2020 8:56 AM Performed by: Janace Litten, CRNA Pre-anesthesia Checklist: Patient identified, Emergency Drugs available, Suction available and Patient being monitored Patient Re-evaluated:Patient Re-evaluated prior to induction Oxygen Delivery Method: Circle System Utilized Preoxygenation: Pre-oxygenation with 100% oxygen Induction Type: IV induction Ventilation: Mask ventilation without difficulty Laryngoscope Size: Mac and 4 Grade View: Grade I Tube type: Oral Tube size: 7.5 mm Number of attempts: 1 Airway Equipment and Method: Stylet Placement Confirmation: ETT inserted through vocal cords under direct vision,  positive ETCO2 and breath sounds checked- equal and bilateral Secured at: 23 cm Tube secured with: Tape Dental Injury: Teeth and Oropharynx as per pre-operative assessment

## 2020-06-14 NOTE — H&P (Signed)
Devin Nunez  Location: [facility] Patient #: [id number] DOB: Oct 11, 1961 Married / Language: English / Race: White Male   History of Present Illness The patient is a 59 year old male who presents with abdominal pain. We are asked to see the patient in consultation by Dr. Crosby Oyster to evaluate him for a right inguinal hernia. The patient is a 59 year old white male who first noticed some pain in the right groin and a bulge about 3-4 months ago. He had one episode of very severe pain where he almost went to the emergency department. He denies any nausea or vomiting. He denies any fevers or chills. He has now also started having some discomfort in the left groin. He is otherwise in good health and does not smoke.   Past Surgical History  No pertinent past surgical history   Diagnostic Studies History  Colonoscopy  5-10 years ago  Allergies  No Known Drug Allergies   Medication History  Doxycycline Monohydrate (150MG  Tablet, Oral) Active. Medications Reconciled  Social History  Alcohol use  Moderate alcohol use. Caffeine use  Coffee. No drug use  Tobacco use  Never smoker.  Family History  First Degree Relatives  No pertinent family history   Other Problems  No pertinent past medical history     Review of Systems  General Not Present- Appetite Loss, Chills, Fatigue, Fever, Night Sweats, Weight Gain and Weight Loss. Skin Not Present- Change in Wart/Mole, Dryness, Hives, Jaundice, New Lesions, Non-Healing Wounds, Rash and Ulcer. HEENT Not Present- Earache, Hearing Loss, Hoarseness, Nose Bleed, Oral Ulcers, Ringing in the Ears, Seasonal Allergies, Sinus Pain, Sore Throat, Visual Disturbances, Wears glasses/contact lenses and Yellow Eyes. Respiratory Not Present- Bloody sputum, Chronic Cough, Difficulty Breathing, Snoring and Wheezing. Breast Not Present- Breast Mass, Breast Pain, Nipple Discharge and Skin Changes. Gastrointestinal Present-  Abdominal Pain and Bloating. Not Present- Bloody Stool, Change in Bowel Habits, Chronic diarrhea, Constipation, Difficulty Swallowing, Excessive gas, Gets full quickly at meals, Hemorrhoids, Indigestion, Nausea, Rectal Pain and Vomiting. Male Genitourinary Not Present- Blood in Urine, Change in Urinary Stream, Frequency, Impotence, Nocturia, Painful Urination, Urgency and Urine Leakage. Musculoskeletal Not Present- Back Pain, Joint Pain, Joint Stiffness, Muscle Pain, Muscle Weakness and Swelling of Extremities. Neurological Not Present- Decreased Memory, Fainting, Headaches, Numbness, Seizures, Tingling, Tremor, Trouble walking and Weakness. Psychiatric Not Present- Anxiety, Bipolar, Change in Sleep Pattern, Depression, Fearful and Frequent crying. Endocrine Not Present- Cold Intolerance, Excessive Hunger, Hair Changes, Heat Intolerance, Hot flashes and New Diabetes. Hematology Not Present- Blood Thinners, Easy Bruising, Excessive bleeding, Gland problems, HIV and Persistent Infections.  Vitals  Weight: 211.5 lb Height: 73in Body Surface Area: 2.2 m Body Mass Index: 27.9 kg/m  Temp.: 98.30F (Tympanic)  Pulse: 89 (Regular)  P.OX: 98% (Room air) BP: 124/92(Sitting, Left Arm, Standard)       Physical Exam  General Mental Status-Alert. General Appearance-Consistent with stated age. Hydration-Well hydrated. Voice-Normal.  Head and Neck Head-normocephalic, atraumatic with no lesions or palpable masses. Trachea-midline. Thyroid Gland Characteristics - normal size and consistency.  Eye Eyeball - Bilateral-Extraocular movements intact. Sclera/Conjunctiva - Bilateral-No scleral icterus.  Chest and Lung Exam Chest and lung exam reveals -quiet, even and easy respiratory effort with no use of accessory muscles and on auscultation, normal breath sounds, no adventitious sounds and normal vocal resonance. Inspection Chest Wall - Normal. Back -  normal.  Cardiovascular Cardiovascular examination reveals -normal heart sounds, regular rate and rhythm with no murmurs and normal pedal pulses bilaterally.  Abdomen Inspection  Inspection of the abdomen reveals - No Hernias. Skin - Scar - no surgical scars. Palpation/Percussion Palpation and Percussion of the abdomen reveal - Soft, Non Tender, No Rebound tenderness, No Rigidity (guarding) and No hepatosplenomegaly. Auscultation Auscultation of the abdomen reveals - Bowel sounds normal.  Male Genitourinary Note: There is a moderate size reducible bulge in the right groin. It is difficult to tell if there is an impulse with straining in the left groin. There is normal-appearing external genitalia   Neurologic Neurologic evaluation reveals -alert and oriented x 3 with no impairment of recent or remote memory. Mental Status-Normal.  Musculoskeletal Normal Exam - Left-Upper Extremity Strength Normal and Lower Extremity Strength Normal. Normal Exam - Right-Upper Extremity Strength Normal and Lower Extremity Strength Normal.  Lymphatic Head & Neck  General Head & Neck Lymphatics: Bilateral - Description - Normal. Axillary  General Axillary Region: Bilateral - Description - Normal. Tenderness - Non Tender. Femoral & Inguinal  Generalized Femoral & Inguinal Lymphatics: Bilateral - Description - Normal. Tenderness - Non Tender.    Assessment & Plan  INGUINAL HERNIA OF RIGHT SIDE WITHOUT OBSTRUCTION OR GANGRENE (K40.90) Impression: The patient appears to have a reducible right inguinal hernia that is symptomatic. Because of the risk of incarceration and strangulation I get benefit from having this fixed. He would also like to have this done. I have discussed with him in detail the risks and benefits of the operation affix the hernia as well as some of the technical aspects including the use of mesh and the risk of chronic pain and he understands and wishes to proceed. He may  also have a small hernia on the left side but it is difficult to appreciate on physical exam. Because of this I would like to evaluate him preoperatively with a CT scan of the pelvis. If he has hernias on both sides then we can plan to fix both sides at the same time. This patient encounter took 45 minutes today to perform the following: take history, perform exam, review outside records, interpret imaging, counsel the patient on their diagnosis and document encounter, findings & plan in the EHR LEFT GROIN PAIN (R10.32) Current Plans CT, pelvis; w/o contrast material(72192)(ACR 0 )(DSN [id number])(G-Code G1004(MG)) (Clinical Scenarios: No content available for this procedure)

## 2020-06-14 NOTE — Op Note (Signed)
06/14/2020  10:19 AM  PATIENT:  Devin Nunez  59 y.o. male  PRE-OPERATIVE DIAGNOSIS:  RIGHT INGUINAL HERNIA  POST-OPERATIVE DIAGNOSIS:  RIGHT INGUINAL HERNIA  PROCEDURE:  Procedure(s) with comments: HERNIA REPAIR INGUINAL ADULT RIGHT WITH MESH (Right) - GENERAL AND TAP BLOCK  SURGEON:  Surgeon(s) and Role:    * Griselda Miner, MD - Primary  PHYSICIAN ASSISTANT:   ASSISTANTS: none   ANESTHESIA:   local and general  EBL:  minimal   BLOOD ADMINISTERED:none  DRAINS: none   LOCAL MEDICATIONS USED:  MARCAINE     SPECIMEN:  No Specimen  DISPOSITION OF SPECIMEN:  N/A  COUNTS:  YES  TOURNIQUET:  * No tourniquets in log *  DICTATION: .Dragon Dictation   After informed consent was obtained the patient was brought to the operating room and placed in the supine position on the operating table.  After adequate induction of general anesthesia the patient's abdomen and right groin were prepped with ChloraPrep, allowed to dry, and draped in usual sterile manner.  An appropriate timeout was performed.  The right groin was then infiltrated with quarter percent Marcaine.  A small incision was made from the edge of the pubic tubercle on the right towards the anterior superior iliac spine.  The incision was carried through the skin and subcutaneous tissue sharply with the electrocautery until the fascia of the external oblique was encountered.  A small bridging vein was clamped with hemostats, divided, and ligated with 3-0 silk ties.  The external Bleich fascia was then opened along its fibers towards the apex of the external ring with a 15 blade knife and Metzenbaum scissors.  A [place] retractor was deployed.  Blunt dissection was carried out of the cord structures until they can be surrounded between 2 fingers.  1/2 inch Penrose drain was placed around the cord structures for retraction purposes.  The cord was then gently skeletonized by blunt hemostat dissection.  I was able to identify the vas  deferens and testicular vessels and keep these away from the dissection area.  The sac was also identified.  The sac had a fairly broad base and appeared to have contents within it that would poorly reduce consistent with at least some element of a sliding hernia.  Because of this the hernia sac was reduced back beneath the transversalis muscle.  The floor of the canal was repaired with interrupted 0 Vicryl stitches.  A 3 x 6 piece of ultra Pro mesh was then chosen and cut to the appropriate size.  The mesh was sewed inferiorly to the shelving edge of the inguinal ligament with a running 2-0 Prolene stitch.  Tails were cut in the mesh laterally and the tails were wrapped around the cord structures.  Superiorly the mesh was sewed to the musculoaponeurotic strength layer of the transversalis with interrupted 2-0 Prolene vertical mattress stitches.  The tails of the mesh were anchored to the shelving edge of the inguinal ligament lateral to the cord with interrupted 2-0 Prolene stitches.  Once this was accomplished the mesh appeared to be in good position and the hernia seem well repaired.  The wound was irrigated with saline.  During the dissection the ilioinguinal nerve was identified and involved with scar tissue.  Because of this it was dissected out proximally distally, clamped, divided, and ligated with 3-0 silk ties.  Next the external oblique fascia was then reapproximated with a running 2-0 Vicryl stitch.  The wound was then infiltrated with more quarter percent  Marcaine.  The subcutaneous fascia was closed with a running 3-0 Vicryl stitch.  The skin was then closed with a running 4-0 Monocryl subcuticular stitch.  Dermabond dressings were applied.  The patient tolerated the procedure well.  At the end of the case all needle sponge and instrument counts were correct.  The patient was then awakened and taken to recovery in stable condition.  The patient's testicle was in his scrotum at the end of the  case.  PLAN OF CARE: Discharge to home after PACU  PATIENT DISPOSITION:  PACU - hemodynamically stable.   Delay start of Pharmacological VTE agent (>24hrs) due to surgical blood loss or risk of bleeding: not applicable

## 2020-06-14 NOTE — Anesthesia Postprocedure Evaluation (Signed)
Anesthesia Post Note  Patient: Devin Nunez  Procedure(s) Performed: HERNIA REPAIR INGUINAL ADULT RIGHT WITH MESH (Right Abdomen)     Patient location during evaluation: PACU Anesthesia Type: General Level of consciousness: awake and alert Pain management: pain level controlled Vital Signs Assessment: post-procedure vital signs reviewed and stable Respiratory status: spontaneous breathing, nonlabored ventilation and respiratory function stable Cardiovascular status: blood pressure returned to baseline and stable Postop Assessment: no apparent nausea or vomiting Anesthetic complications: no   No complications documented.  Last Vitals:  Vitals:   06/14/20 1054 06/14/20 1109  BP: (!) 154/110 (!) 165/97  Pulse: 64 70  Resp: 13 17  Temp:  (!) 36.3 C  SpO2: 98% 100%    Last Pain:  Vitals:   06/14/20 1109  TempSrc:   PainSc: 1                  Lowella Curb

## 2020-06-17 ENCOUNTER — Encounter (HOSPITAL_COMMUNITY): Payer: Self-pay | Admitting: General Surgery

## 2020-06-20 DIAGNOSIS — L718 Other rosacea: Secondary | ICD-10-CM | POA: Diagnosis not present

## 2020-06-20 DIAGNOSIS — L820 Inflamed seborrheic keratosis: Secondary | ICD-10-CM | POA: Diagnosis not present

## 2021-03-20 DIAGNOSIS — L308 Other specified dermatitis: Secondary | ICD-10-CM | POA: Diagnosis not present

## 2021-05-02 ENCOUNTER — Encounter: Payer: BC Managed Care – PPO | Admitting: Medical

## 2021-06-10 ENCOUNTER — Encounter: Payer: Self-pay | Admitting: Medical

## 2021-06-10 ENCOUNTER — Ambulatory Visit (INDEPENDENT_AMBULATORY_CARE_PROVIDER_SITE_OTHER): Payer: BC Managed Care – PPO | Admitting: Medical

## 2021-06-10 ENCOUNTER — Other Ambulatory Visit: Payer: Self-pay

## 2021-06-10 VITALS — BP 130/88 | HR 75 | Ht 73.0 in | Wt 210.2 lb

## 2021-06-10 DIAGNOSIS — E785 Hyperlipidemia, unspecified: Secondary | ICD-10-CM | POA: Diagnosis not present

## 2021-06-10 DIAGNOSIS — Z9889 Other specified postprocedural states: Secondary | ICD-10-CM

## 2021-06-10 DIAGNOSIS — Z1211 Encounter for screening for malignant neoplasm of colon: Secondary | ICD-10-CM

## 2021-06-10 DIAGNOSIS — Z131 Encounter for screening for diabetes mellitus: Secondary | ICD-10-CM

## 2021-06-10 DIAGNOSIS — Z125 Encounter for screening for malignant neoplasm of prostate: Secondary | ICD-10-CM | POA: Diagnosis not present

## 2021-06-10 DIAGNOSIS — Z0000 Encounter for general adult medical examination without abnormal findings: Secondary | ICD-10-CM

## 2021-06-10 DIAGNOSIS — Z7185 Encounter for immunization safety counseling: Secondary | ICD-10-CM | POA: Insufficient documentation

## 2021-06-10 NOTE — Progress Notes (Signed)
Subjective:   HPI  Devin Nunez is a 60 y.o. male who presents for Chief Complaint  Patient presents with   fasting cpe    Fasting cpe, no other concerns    Patient Care Team: Briya Lookabaugh, Leward Quan as PCP - General (Family Medicine) Sees dentist Sees eye doctor, Dr. Marikay [facility]  Concerns: None, doing well  Had hernia surgery last year   Reviewed their medical, surgical, family, social, medication, and allergy history and updated chart as appropriate.  Past Medical History:  Diagnosis Date   Elevated PSA 2008   hx/o with benign biopsy   Hordeolum 2015   Dr. Katy Fitch, prior Doxycycline therapy   PVC (premature ventricular contraction)    Rosacea    takes Doxycycline every other day, uses facial creams;  [facility]   Shingles 2008   chest/back   Wisdom teeth removed     Past Surgical History:  Procedure Laterality Date   COLONOSCOPY  04/2014   normal colonoscopy, Dr. Neville Route HERNIA REPAIR Right 06/14/2020   Procedure: HERNIA REPAIR INGUINAL ADULT RIGHT WITH MESH;  Surgeon: Jovita Kussmaul, MD;  Location: [place];  Service: General;  Laterality: Right;  GENERAL AND TAP BLOCK   PROSTATE BIOPSY  2008   reportedly benign   WISDOM TOOTH EXTRACTION      Family History  Problem Relation Age of Onset   Other Mother        died 25 of old age   Heart disease Father 60       MI, CABG   Cancer Cousin 16       colon   Diabetes Neg Hx    Hypertension Neg Hx    Stroke Neg Hx      Current Outpatient Medications:    Doxycycline Hyclate 50 MG TABS, Take 50 mg by mouth every other day. , Disp: , Rfl:   No Known Allergies   Review of Systems Constitutional: -fever, -chills, -sweats, -unexpected weight change, -decreased appetite, -fatigue Allergy: -sneezing, -itching, -congestion Dermatology: -changing moles, --rash, -lumps ENT: -runny nose, -ear pain, -sore throat, -hoarseness, -sinus pain, -teeth pain, - ringing in ears, -hearing loss,  -nosebleeds Cardiology: -chest pain, -palpitations, -swelling, -difficulty breathing when lying flat, -waking up short of breath Respiratory: -cough, -shortness of breath, -difficulty breathing with exercise or exertion, -wheezing, -coughing up blood Gastroenterology: -abdominal pain, -nausea, -vomiting, -diarrhea, -constipation, -blood in stool, -changes in bowel movement, -difficulty swallowing or eating Hematology: -bleeding, -bruising  Musculoskeletal: -joint aches, -muscle aches, -joint swelling, -back pain, -neck pain, -cramping, -changes in gait Ophthalmology: denies vision changes, eye redness, itching, discharge Urology: -burning with urination, -difficulty urinating, -blood in urine, -urinary frequency, -urgency, -incontinence Neurology: -headache, -weakness, -tingling, -numbness, -memory loss, -falls, -dizziness Psychology: -depressed mood, -agitation, -sleep problems Male GU: no testicular mass, pain, no lymph nodes swollen, no swelling, no rash.  Depression screen [facility] 2/9 06/10/2021 02/20/2020 10/30/2016  Decreased Interest 0 0 0  Down, Depressed, Hopeless 0 0 0  PHQ - 2 Score 0 0 0        Objective:  BP 130/88   Pulse 75   Ht 6' 1"  (1.854 m)   Wt 210 lb 3.2 oz (95.3 kg)   SpO2 98%   BMI 27.73 kg/m   General appearance: alert, no distress, WD/WN, Caucasian male Skin: mild erythema of nose, otherwise unremarkable HEENT: normocephalic, conjunctiva/corneas normal, sclerae anicteric, PERRLA, EOMi, nares patent, no discharge or erythema, pharynx normal Oral cavity: MMM, tongue normal,  teeth normal Neck: supple, no lymphadenopathy, no thyromegaly, no masses, normal ROM, no bruits Chest: non tender, normal shape and expansion Heart: RRR, normal S1, S2, no murmurs Lungs: CTA bilaterally, no wheezes, rhonchi, or rales Abdomen: +bs, soft, non tender, non distended, no masses, no hepatomegaly, no splenomegaly, no bruits Back: non tender, normal ROM, no scoliosis Musculoskeletal:  upper extremities non tender, no obvious deformity, normal ROM throughout, lower extremities non tender, no obvious deformity, normal ROM throughout Extremities: no edema, no cyanosis, no clubbing Pulses: 2+ symmetric, upper and lower extremities, normal cap refill Neurological: alert, oriented x 3, CN2-12 intact, strength normal upper extremities and lower extremities, sensation normal throughout, DTRs 2+ throughout, no cerebellar signs, gait normal Psychiatric: normal affect, behavior normal, pleasant  GU: right inguinal hernia surgical scar, normal male external genitalia,circumcised, nontender, no masses, no hernia, no lymphadenopathy Rectal: declined   Assessment and Plan :   Encounter Diagnoses  Name Primary?   Encounter for health maintenance examination in adult Yes   History of prostate biopsy    Hyperlipidemia, unspecified hyperlipidemia type    Screen for colon cancer    Screening for prostate cancer    Vaccine counseling    Screening for diabetes mellitus     This visit was a preventative care visit, also known as wellness visit or routine physical.   Topics typically include healthy lifestyle, diet, exercise, preventative care, vaccinations, sick and well care, proper use of emergency dept and after hours care, as well as other concerns.     Recommendations: Continue to return yearly for your annual wellness and preventative care visits.  This gives [place] a chance to discuss healthy lifestyle, exercise, vaccinations, review your chart record, and perform screenings where appropriate.  I recommend you see your eye doctor yearly for routine vision care.  I recommend you see your dentist yearly for routine dental care including hygiene visits twice yearly.   Vaccination recommendations were reviewed Immunization History  Administered Date(s) Administered   PFIZER(Purple Top)SARS-COV-2 Vaccination 12/30/2019, 01/20/2020   Tdap 02/15/2014, 09/15/2018   Tetanus 09/15/2018     Shingles vaccine:  I recommend you have a shingles vaccine to help prevent shingles or herpes zoster outbreak.   Please call your insurer to inquire about coverage for the Shingrix vaccine given in 2 doses.   Some insurers cover this vaccine after age 12, some cover this after age 56.  If your insurer covers this, then call to schedule appointment to have this vaccine here.    Screening for cancer: Colon cancer screening: I reviewed your colonoscopy on file that is up to date from 2015 You were given stool cards kit to return for hemoccult screening  We discussed PSA, prostate exam, and prostate cancer screening risks/benefits.     Skin cancer screening: Check your skin regularly for new changes, growing lesions, or other lesions of concern Come in for evaluation if you have skin lesions of concern.  Lung cancer screening: If you have a greater than 20 pack year history of tobacco use, then you may qualify for lung cancer screening with a chest CT scan.   Please call your insurance company to inquire about coverage for this test.  We currently don't have screenings for other cancers besides breast, cervical, colon, and lung cancers.  If you have a strong family history of cancer or have other cancer screening concerns, please let me know.    Bone health: Get at least 150 minutes of aerobic exercise weekly Get weight bearing  exercise at least once weekly Bone density test:  A bone density test is an imaging test that uses a type of X-ray to measure the amount of calcium and other minerals in your bones. The test may be used to diagnose or screen you for a condition that causes weak or thin bones (osteoporosis), predict your risk for a broken bone (fracture), or determine how well your osteoporosis treatment is working. The bone density test is recommended for females 72 and older, or females or males <32 if certain risk factors such as thyroid disease, long term use of steroids such  as for asthma or rheumatological issues, vitamin D deficiency, estrogen deficiency, family history of osteoporosis, self or family history of fragility fracture in first degree relative.    Heart health: Get at least 150 minutes of aerobic exercise weekly Limit alcohol It is important to maintain a healthy blood pressure and healthy cholesterol numbers  Heart disease screening: Screening for heart disease includes screening for blood pressure, fasting lipids, glucose/diabetes screening, BMI height to weight ratio, reviewed of smoking status, physical activity, and diet.    Goals include blood pressure 120/80 or less, maintaining a healthy lipid/cholesterol profile, preventing diabetes or keeping diabetes numbers under good control, not smoking or using tobacco products, exercising most days per week or at least 150 minutes per week of exercise, and eating healthy variety of fruits and vegetables, healthy oils, and avoiding unhealthy food choices like fried food, fast food, high sugar and high cholesterol foods.    Other tests may possibly include EKG test, CT coronary calcium score, echocardiogram, exercise treadmill stress test.   I reviewed the transthoracic echocardiogram done Mar 21, 2014 showing normal LVEF to 55%, mild to moderate mitral regurgitation, slight left atrial enlargement, Dr. Tollie Eth    Medical care options: I recommend you continue to seek care here first for routine care.  We try really hard to have available appointments Monday through Friday daytime hours for sick visits, acute visits, and physicals.  Urgent care should be used for after hours and weekends for significant issues that cannot wait till the next day.  The emergency department should be used for significant potentially life-threatening emergencies.  The emergency department is expensive, can often have long wait times for less significant concerns, so try to utilize primary care, urgent care, or  telemedicine when possible to avoid unnecessary trips to the emergency department.  Virtual visits and telemedicine have been introduced since the pandemic started in 2020, and can be convenient ways to receive medical care.  We offer virtual appointments as well to assist you in a variety of options to seek medical care.   Advanced Directives: I recommend you consider completing a [place] and Living Will.   These documents respect your wishes and help alleviate burdens on your loved ones if you were to become terminally ill or be in a position to need those documents enforced.    You can complete Advanced Directives yourself, have them notarized, then have copies made for our office, for you and for anybody you feel should have them in safe keeping.  Or, you can have an attorney prepare these documents.   If you haven't updated your Last Will and Testament in a while, it may be worthwhile having an attorney prepare these documents together and save on some costs.       Separate significant issues discussed: Rosacea -on oral doxycycline as well as topical therapy, managed by dermatology  Hyperlipidemia -not on statin.  Offered statin last year but he declined.  Repeat fasting labs today.  Will likely pursue CT coronary calcium score     Oseas was seen today for fasting cpe.  Diagnoses and all orders for this visit:  Encounter for health maintenance examination in adult -     PSA, total and free -     Lipid panel -     Comprehensive metabolic panel -     CBC -     Hemoglobin A1c  History of prostate biopsy  Hyperlipidemia, unspecified hyperlipidemia type -     Lipid panel  Screen for colon cancer -     Fecal occult blood, imunochemical  Screening for prostate cancer -     PSA, total and free  Vaccine counseling  Screening for diabetes mellitus -     Hemoglobin A1c    Follow-up pending labs, yearly for physical

## 2021-06-11 ENCOUNTER — Other Ambulatory Visit: Payer: Self-pay | Admitting: Medical

## 2021-06-11 DIAGNOSIS — E785 Hyperlipidemia, unspecified: Secondary | ICD-10-CM

## 2021-06-11 DIAGNOSIS — Z8249 Family history of ischemic heart disease and other diseases of the circulatory system: Secondary | ICD-10-CM

## 2021-06-11 DIAGNOSIS — Z136 Encounter for screening for cardiovascular disorders: Secondary | ICD-10-CM

## 2021-06-11 LAB — PSA, TOTAL AND FREE
PSA, Free Pct: 19.5 %
PSA, Free: 0.72 ng/mL
Prostate Specific Ag, Serum: 3.7 ng/mL (ref 0.0–4.0)

## 2021-06-11 LAB — HEMOGLOBIN A1C
Est. average glucose Bld gHb Est-mCnc: 114 mg/dL
Hgb A1c MFr Bld: 5.6 % (ref 4.8–5.6)

## 2021-06-11 LAB — COMPREHENSIVE METABOLIC PANEL
ALT: 15 IU/L (ref 0–44)
AST: 20 IU/L (ref 0–40)
Albumin/Globulin Ratio: 1.8 (ref 1.2–2.2)
Albumin: 4.4 g/dL (ref 3.8–4.8)
Alkaline Phosphatase: 60 IU/L (ref 44–121)
BUN/Creatinine Ratio: 15 (ref 10–24)
BUN: 15 mg/dL (ref 8–27)
Bilirubin Total: 0.8 mg/dL (ref 0.0–1.2)
CO2: 22 mmol/L (ref 20–29)
Calcium: 9.7 mg/dL (ref 8.6–10.2)
Chloride: 98 mmol/L (ref 96–106)
Creatinine, Ser: 1 mg/dL (ref 0.76–1.27)
Globulin, Total: 2.4 g/dL (ref 1.5–4.5)
Glucose: 98 mg/dL (ref 65–99)
Potassium: 4.4 mmol/L (ref 3.5–5.2)
Sodium: 134 mmol/L (ref 134–144)
Total Protein: 6.8 g/dL (ref 6.0–8.5)
eGFR: 86 mL/min/{1.73_m2} (ref >59)

## 2021-06-11 LAB — CBC
Hematocrit: 44.1 % (ref 37.5–51.0)
Hemoglobin: 14.9 g/dL (ref 13.0–17.7)
MCH: 31.2 pg (ref 26.6–33.0)
MCHC: 33.8 g/dL (ref 31.5–35.7)
MCV: 92 fL (ref 79–97)
Platelets: 255 10*3/uL (ref 150–450)
RBC: 4.78 x10E6/uL (ref 4.14–5.80)
RDW: 11.8 % (ref 11.6–15.4)
WBC: 6.6 10*3/uL (ref 3.4–10.8)

## 2021-06-11 LAB — LIPID PANEL
Chol/HDL Ratio: 4.1 ratio (ref 0.0–5.0)
Cholesterol, Total: 265 mg/dL — ABNORMAL HIGH (ref 100–199)
HDL: 64 mg/dL (ref >39)
LDL Chol Calc (NIH): 175 mg/dL — ABNORMAL HIGH (ref 0–99)
Triglycerides: 147 mg/dL (ref 0–149)
VLDL Cholesterol Cal: 26 mg/dL (ref 5–40)

## 2021-06-11 MED ORDER — ROSUVASTATIN CALCIUM 10 MG PO TABS: 10.0000 mg | ORAL_TABLET | Freq: Every day | ORAL | 3 refills | Status: DC

## 2021-06-27 ENCOUNTER — Ambulatory Visit
Admission: RE | Admit: 2021-06-27 | Discharge: 2021-06-27 | Disposition: A | Discharge status: Home / Self Care | Payer: No Typology Code available for payment source | Source: Ambulatory Visit | Attending: Medical | Admitting: Medical

## 2021-06-27 DIAGNOSIS — E785 Hyperlipidemia, unspecified: Secondary | ICD-10-CM

## 2021-06-27 DIAGNOSIS — Z8249 Family history of ischemic heart disease and other diseases of the circulatory system: Secondary | ICD-10-CM

## 2021-06-27 DIAGNOSIS — Z136 Encounter for screening for cardiovascular disorders: Secondary | ICD-10-CM

## 2021-06-30 ENCOUNTER — Other Ambulatory Visit: Payer: Self-pay | Admitting: Medical

## 2021-07-22 DIAGNOSIS — L718 Other rosacea: Secondary | ICD-10-CM | POA: Diagnosis not present

## 2021-07-22 DIAGNOSIS — D225 Melanocytic nevi of trunk: Secondary | ICD-10-CM | POA: Diagnosis not present

## 2021-07-22 DIAGNOSIS — L814 Other melanin hyperpigmentation: Secondary | ICD-10-CM | POA: Diagnosis not present

## 2021-07-22 DIAGNOSIS — L821 Other seborrheic keratosis: Secondary | ICD-10-CM | POA: Diagnosis not present

## 2021-08-05 ENCOUNTER — Encounter: Payer: Self-pay | Admitting: Internal Medicine

## 2021-10-26 HISTORY — PX: COLONOSCOPY: SHX174

## 2021-11-28 DIAGNOSIS — R197 Diarrhea, unspecified: Secondary | ICD-10-CM | POA: Diagnosis not present

## 2021-12-11 ENCOUNTER — Encounter: Payer: Self-pay | Admitting: Internal Medicine

## 2021-12-16 ENCOUNTER — Other Ambulatory Visit (INDEPENDENT_AMBULATORY_CARE_PROVIDER_SITE_OTHER): Payer: BC Managed Care – PPO

## 2021-12-16 ENCOUNTER — Other Ambulatory Visit: Payer: Self-pay

## 2021-12-16 DIAGNOSIS — Z23 Encounter for immunization: Secondary | ICD-10-CM

## 2022-02-06 ENCOUNTER — Encounter: Payer: Self-pay | Admitting: Physician Assistant

## 2022-02-06 ENCOUNTER — Telehealth: Payer: BC Managed Care – PPO | Admitting: Physician Assistant

## 2022-02-06 VITALS — Ht 73.0 in | Wt 210.0 lb

## 2022-02-06 DIAGNOSIS — R195 Other fecal abnormalities: Secondary | ICD-10-CM | POA: Diagnosis not present

## 2022-02-06 DIAGNOSIS — R197 Diarrhea, unspecified: Secondary | ICD-10-CM

## 2022-02-06 NOTE — Progress Notes (Signed)
Start time: 3:52 pm ?End time: 4:10 pm ? ?Virtual Visit via Video Note ? ? Patient ID: Devin Nunez, male    DOB: [birth year], 61 y.o.   MRN: [id number] ? ?I connected with above patient on 02/09/22 by a video enabled telemedicine application and verified that I am speaking with the correct person using two identifiers. ? ?Location: ?Patient: home ?Provider: office ?  ?I discussed the limitations of evaluation and management by telemedicine and the availability of in person appointments. The patient expressed understanding and agreed to proceed. ? ?History of Present Illness: ? ?Chief Complaint  ?Patient presents with  ? Acute Visit  ?  Virtual- diarrhea and sometimes normal BM   ? ?Reports a 10 - 12 week history of a change in bowel habits with diarrhea; denies travel; denies sick contacts; denies blood in stool; denies abdominal pain; denies GERD symptoms; last colonoscopy was 2015 and was normal; reports that he recently added fiber to his diet, but can't tell if that's made a difference yet. ? ?  ?Observations/Objective: ? ?Ht 6\' 1"  (1.854 m)   Wt 210 lb (95.3 kg)   BMI 27.71 kg/m?  ? ? ?Assessment: ?Encounter Diagnoses  ?Name Primary?  ? Change in stool Yes  ? Diarrhea, unspecified type   ? ? ? ?Plan: ?Continue fiber supplement ?Referral to GI for further evaluation ? ?Lute was seen today for acute visit. ? ?Diagnoses and all orders for this visit: ? ?Change in stool ? ?Diarrhea, unspecified type ? ? ? ?Follow up:  ? ? ?I discussed the assessment and treatment plan with the patient. The patient was provided an opportunity to ask questions and all were answered. The patient agreed with the plan and demonstrated an understanding of the instructions. ?  ?The patient was advised to call back or seek an in-person evaluation if the symptoms worsen or if the condition fails to improve as anticipated. For emergencies go to Urgent Care or the Emergency Department for immediate evaluation.  ? ?I spent 15 minutes dedicated  to the care of this patient, including pre-visit review of records, face to face time, post-visit ordering of testing and documentation. ? ? ? ?Irene Pap, PA-C ?

## 2022-02-18 ENCOUNTER — Other Ambulatory Visit (INDEPENDENT_AMBULATORY_CARE_PROVIDER_SITE_OTHER): Payer: BC Managed Care – PPO

## 2022-02-18 DIAGNOSIS — Z23 Encounter for immunization: Secondary | ICD-10-CM

## 2022-03-20 ENCOUNTER — Encounter: Payer: Self-pay | Admitting: Gastroenterology

## 2022-03-20 ENCOUNTER — Ambulatory Visit: Payer: BC Managed Care – PPO | Admitting: Gastroenterology

## 2022-03-20 ENCOUNTER — Other Ambulatory Visit (INDEPENDENT_AMBULATORY_CARE_PROVIDER_SITE_OTHER): Payer: BC Managed Care – PPO

## 2022-03-20 VITALS — BP 144/100 | HR 72 | Ht 72.25 in | Wt 213.1 lb

## 2022-03-20 DIAGNOSIS — R197 Diarrhea, unspecified: Secondary | ICD-10-CM | POA: Diagnosis not present

## 2022-03-20 LAB — TSH: TSH: 1.34 u[IU]/mL (ref 0.35–5.50)

## 2022-03-20 LAB — C-REACTIVE PROTEIN: CRP: <1 mg/dL (ref 0.5–20.0)

## 2022-03-20 LAB — SEDIMENTATION RATE: Sed Rate: 14 mm/hr (ref 0–20)

## 2022-03-20 MED ORDER — NA SULFATE-K SULFATE-MG SULF 17.5-3.13-1.6 GM/177ML PO SOLN: 1.0000 | Freq: Once | ORAL | 0 refills | Status: AC

## 2022-03-20 NOTE — Patient Instructions (Addendum)
It was my pleasure to provide care to you today. Based on our discussion, I am providing you with my recommendations below:  RECOMMENDATION(S):   I have recommended several stool, blood tests, and a colonoscopy to evaluate your diarrhea.  Drink a lot of liquids that have water, salt, and sugar. Good choices are water mixed with juice, flavored soda, and soup broth. If you are drinking enough, your urine will be light yellow or almost clear.  Avoid high fat foods, as they can make diarrhea worse.   Dairy products (except yogurt) may be difficult to digest when you have diarrhea. I recommend that you temporarily avoid lactose-containing foods.   I recommend a trial of loperamide 4 mg initially, then 2 mg after each unformed stool for ?2 days, with a maximum of 16 mg/day.  If that is not working, you could try bismuth salicylate (Pepto-Bismol) 30 mL or two tablets every 30 minutes for eight doses. Pepto-Bismol may make your stools black.  Call with any concerns prior to the results being available.  LABS:   Please proceed to the basement level for lab work before leaving today. Press "B" on the elevator. The lab is located at the first door on the left as you exit the elevator.  COLONOSCOPY:   You have been scheduled for a colonoscopy. Please follow written instructions given to you at your visit today.   PREP:   Please pick up your prep supplies at the pharmacy within the next 1-3 days.  COLONOSCOPY TIPS:  To reduce nausea and dehydration, stay well hydrated for 3-4 days prior to the exam.  To prevent skin/hemorrhoid irritation - prior to wiping, put A&Dointment or vaseline on the toilet paper. Keep a towel or pad on the bed.  BEFORE STARTING YOUR PREP, drink  64oz of clear liquids in the morning. This will help to flush the colon and will ensure you are well hydrated!!!!  NOTE - This is in addition to the fluids required for to complete your prep. Use of a flavored hard candy,  such as grape Rubin Payor, can counteract some of the flavor of the prep and may prevent some nausea.    FOLLOW UP:  After your procedure, you will receive a call from my office staff regarding my recommendation for follow up.  BMI:  If you are age 55 or younger, your body mass index should be between 19-25. Your Body mass index is 28.71 kg/m. If this is out of the aformentioned range listed, please consider follow up with your Primary Care Provider.   MY CHART:  The [facility] providers would like to encourage you to use [facility] to communicate with providers for non-urgent requests or questions.  Due to long hold times on the telephone, sending your provider a message by [facility] may be a faster and more efficient way to get a response.  Please allow 48 business hours for a response.  Please remember that this is for non-urgent requests.   Thank you for trusting me with your gastrointestinal care!    Tressia Danas, MD, MPH

## 2022-03-20 NOTE — Progress Notes (Signed)
Referring Provider: Carlena Hurl, PA-C Primary Care Physician:  Carlena Hurl, PA-C   Reason for Consultation:  Diarrhea   IMPRESSION:  Chronic diarrhea. The differential diagnosis of chronic diarrhea without alarm features includes: irritable bowel syndrome, IBD, celiac disease, missed infection (such as giardia), food intolerance, microscopic colitis, thyroid disorder, other functional GI disease. By history, this is less likely to be obstruction or overflow diarrhea.   PLAN: - Fecal calprotectin, Giardia, pancreatic elastase - TTGA and IgA - CMP - TSH - ESR, CRP - Dietary recommendations (see patient instructions) - Trial of Imodium +/I PeptoBismol - Colonoscopy with random biopsies and evaluation of the terminal ileum  I consented the patient at the bedside today discussing the risks, benefits, and alternatives to endoscopic evaluation. In particular, we discussed the risks that include, but are not limited to, reaction to medication, cardiopulmonary compromise, bleeding requiring blood transfusion, aspiration resulting in pneumonia, perforation requiring surgery, and even death. We reviewed the risk of missed lesion including polyps or even cancer. The patient acknowledges these risks and asks that we proceed.     HPI: Devin Nunez is a 61 y.o. male referred by [facility] for further evaluation of altered bowel habits. The history is obtained through the patient and review of his electronic health record. He had bilateral inguinal hernia surgery in August 2021.   Developed diarrhea 12-14 weeks ago. Occurring 3 times a week. Associated urgency. Stools are liquid but no incontinence.  Lower abdominal pain frequently precedes defecation.  No blood or mucous in the stool. Has increased the fiber in his diet and has had fiber gummies without significant change.   No recent labs or stool studies performed to evaluate his symptoms.   Denies a precipitating event,  trauma, close contacts with similar symptoms, changes in diet, recent travel or antibiotic use.    Colonoscopy in 2015 with Dr. Collene Mares. Normal colonoscopy. No diarrhea occurring at that time.   No prior abdominal imaging. CT pelvis 2021 to evaluate right inguinal pain showed small bilateral fat containing inguinal hernias.   Normal CMP and CBC 06/10/21  First cousin with a rare kind of  colon cancer at age 76. There is no known family history of colon cancer or polyps. No family history of stomach cancer or other GI malignancy. No family history of inflammatory bowel disease or celiac.    Past Medical History:  Diagnosis Date   Elevated PSA 2008   hx/o with benign biopsy   HLD (hyperlipidemia)    Hordeolum 2015   Dr. Katy Fitch, prior Doxycycline therapy   PVC (premature ventricular contraction)    Rosacea    takes Doxycycline every other day, uses facial creams;  [facility]   Shingles 2008   chest/back   Wisdom teeth removed     Past Surgical History:  Procedure Laterality Date   COLONOSCOPY  04/2014   normal colonoscopy, Dr. Neville Route HERNIA REPAIR Right 06/14/2020   Procedure: HERNIA REPAIR INGUINAL ADULT RIGHT WITH MESH;  Surgeon: Jovita Kussmaul, MD;  Location: [place];  Service: General;  Laterality: Right;  GENERAL AND TAP BLOCK   PROSTATE BIOPSY  2008   reportedly benign   WISDOM TOOTH EXTRACTION        Current Outpatient Medications  Medication Sig Dispense Refill   finasteride (PROPECIA) 1 MG tablet Take 1 mg by mouth daily.     rosuvastatin (CRESTOR) 10 MG tablet Take 1 tablet (10 mg total) by mouth daily. Bokeelia  tablet 3   No current facility-administered medications for this visit.    Allergies as of 03/20/2022   (No Known Allergies)    Family History  Problem Relation Age of Onset   Other Mother        died 48 of old age   Heart disease Father 48       MI, CABG   Heart attack Father    Colon cancer Cousin 58   Diabetes Neg Hx    Hypertension Neg  Hx    Stroke Neg Hx     Social History   Socioeconomic History   Marital status: Married    Spouse name: Not on file   Number of children: 2   Years of education: Not on file   Highest education level: Not on file  Occupational History   Occupation: Freight forwarder  Tobacco Use   Smoking status: Never   Smokeless tobacco: Never  Vaping Use   Vaping Use: Never used  Substance and Sexual Activity   Alcohol use: Yes    Alcohol/week: 12.0 standard drinks    Types: 12 Cans of beer per week    Comment: 2-3 per day   Drug use: No   Sexual activity: Not on file  Other Topics Concern   Not on file  Social History Narrative   Married, 2 children.  Moved from [place] to [place] 2012. Works for [employer], abrasives.  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  Exercise - walks often, 3.5 miles most days.  Stays active, stand up desk. 05/2021   Social Determinants of Health   Financial Resource Strain: Not on file  Food Insecurity: Not on file  Transportation Needs: Not on file  Physical Activity: Not on file  Stress: Not on file  Social Connections: Not on file  Intimate Partner Violence: Not on file    Review of Systems: 12 system ROS is negative except as noted above.   Physical Exam: General:   Alert,  well-nourished, pleasant and cooperative in NAD Head:  Normocephalic and atraumatic. Eyes:  Sclera clear, no icterus.   Conjunctiva pink. Ears:  Normal auditory acuity. Nose:  No deformity, discharge,  or lesions. Mouth:  No deformity or lesions.   Neck:  Supple; no masses or thyromegaly. Lungs:  Clear throughout to auscultation.   No wheezes. Heart:  Regular rate and rhythm; no murmurs. Abdomen:  Soft, nontender, nondistended, normal bowel sounds, no rebound or guarding. No hepatosplenomegaly.   Rectal:  Deferred  Msk:  Symmetrical. No boney deformities LAD: No inguinal or umbilical LAD Extremities:  No clubbing or edema. Neurologic:  Alert  and  oriented x4;  grossly nonfocal Skin:  Intact without significant lesions or rashes. Psych:  Alert and cooperative. Normal mood and affect.     Aaradhya Kysar L. Tarri Glenn, MD, MPH 03/20/2022, 3:29 PM

## 2022-03-24 LAB — IGA: Immunoglobulin A: 316 mg/dL (ref 70–320)

## 2022-03-24 LAB — TISSUE TRANSGLUTAMINASE ABS,IGG,IGA
(tTG) Ab, IgA: <1 U/mL
(tTG) Ab, IgG: <1 U/mL

## 2022-03-30 ENCOUNTER — Other Ambulatory Visit: Payer: BC Managed Care – PPO

## 2022-03-30 DIAGNOSIS — R197 Diarrhea, unspecified: Secondary | ICD-10-CM

## 2022-03-31 LAB — GIARDIA ANTIGEN
MICRO NUMBER:: 13483488
RESULT:: NOT DETECTED
SPECIMEN QUALITY:: ADEQUATE

## 2022-04-08 LAB — CALPROTECTIN, FECAL: Calprotectin, Fecal: 43 ug/g (ref 0–120)

## 2022-04-16 ENCOUNTER — Encounter: Payer: Self-pay | Admitting: Gastroenterology

## 2022-04-19 ENCOUNTER — Encounter: Payer: Self-pay | Admitting: Certified Registered Nurse Anesthetist

## 2022-04-20 ENCOUNTER — Encounter: Payer: Self-pay | Admitting: Gastroenterology

## 2022-04-22 ENCOUNTER — Encounter: Payer: Self-pay | Admitting: Certified Registered Nurse Anesthetist

## 2022-04-23 ENCOUNTER — Telehealth: Payer: Self-pay

## 2022-04-23 ENCOUNTER — Encounter: Payer: Self-pay | Admitting: Gastroenterology

## 2022-04-23 ENCOUNTER — Ambulatory Visit (AMBULATORY_SURGERY_CENTER): Payer: BC Managed Care – PPO | Admitting: Gastroenterology

## 2022-04-23 VITALS — BP 139/88 | HR 65 | Temp 97.8°F | Resp 16 | Ht 72.0 in | Wt 213.0 lb

## 2022-04-23 DIAGNOSIS — K5289 Other specified noninfective gastroenteritis and colitis: Secondary | ICD-10-CM | POA: Diagnosis not present

## 2022-04-23 DIAGNOSIS — R197 Diarrhea, unspecified: Secondary | ICD-10-CM

## 2022-04-23 DIAGNOSIS — K648 Other hemorrhoids: Secondary | ICD-10-CM | POA: Diagnosis not present

## 2022-04-23 DIAGNOSIS — K388 Other specified diseases of appendix: Secondary | ICD-10-CM | POA: Diagnosis not present

## 2022-04-23 MED ORDER — SODIUM CHLORIDE 0.9 % IV SOLN: 500.0000 mL | Freq: Once | INTRAVENOUS | Status: DC

## 2022-04-23 NOTE — Progress Notes (Signed)
Report given to PACU, vss 

## 2022-04-23 NOTE — Op Note (Signed)
[facility] Patient Name: Devin Nunez Procedure Date: 04/23/2022 10:20 AM MRN: [id number] Endoscopist: Tressia Danas MD, MD Age: 61 Referring MD:  Date of Birth: 03-27-1961 Gender: Male Account #: [id number] Procedure:                Colonoscopy Indications:              Clinically significant diarrhea of unexplained                            origin                           Normal colonoscopy with Dr. Loreta Ave in 2015 Medicines:                Monitored Anesthesia Care Procedure:                Pre-Anesthesia Assessment:                           - Prior to the procedure, a History and Physical                            was performed, and patient medications and                            allergies were reviewed. The patient's tolerance of                            previous anesthesia was also reviewed. The risks                            and benefits of the procedure and the sedation                            options and risks were discussed with the patient.                            All questions were answered, and informed consent                            was obtained. Prior Anticoagulants: The patient has                            taken no previous anticoagulant or antiplatelet                            agents. ASA Grade Assessment: II - A patient with                            mild systemic disease. After reviewing the risks                            and benefits, the patient was deemed in  satisfactory condition to undergo the procedure.                           After obtaining informed consent, the colonoscope                            was passed under direct vision. Throughout the                            procedure, the patient's blood pressure, pulse, and                            oxygen saturations were monitored continuously. The                            CF [id number] #[id number] was introduced through the anus                             and advanced to the 5 cm into the ileum. A second                            forward view of the right colon was performed. The                            colonoscopy was performed without difficulty. The                            patient tolerated the procedure well. The quality                            of the bowel preparation was adequate. The terminal                            ileum, ileocecal valve, appendiceal orifice, and                            rectum were photographed. Scope In: 10:27:59 AM Scope Out: 10:45:11 AM Scope Withdrawal Time: 0 hours 14 minutes 31 seconds  Total Procedure Duration: 0 hours 17 minutes 12 seconds  Findings:                 The perianal and digital rectal examinations were                            normal.                           Non-bleeding internal hemorrhoids were found. The                            hemorrhoids were small.                           The colonic mucosa appeared normal. Biopsies were  taken from the right colon, transverse colon, and                            left colon with a cold forceps for histology.                            Estimated blood loss was minimal.                           Smooth surface submucosal prominence of the                            appendiceal orifice. Negative pillow sign. Tunneled                            biopsies were taken with a cold forceps for                            histology. Estimated blood loss was minimal.                           The terminal ileum appeared normal. Biopsies were                            taken with a cold forceps for histology. Estimated                            blood loss was minimal.                           There was some fiercely adherent residual stool                            throughout the colon. The exam was otherwise                            without abnormality on direct and retroflexion                             views. Complications:            No immediate complications. Estimated Blood Loss:     Estimated blood loss was minimal. Impression:               - Non-bleeding internal hemorrhoids.                           - The entire examined colon is normal. Biopsied.                           - Submucosal prominence of the appendiceal orifice.                            Biopsied.                           -  The examined portion of the ileum was normal.                            Biopsied.                           - The examination was otherwise normal on direct                            and retroflexion views. Recommendation:           - Patient has a contact number available for                            emergencies. The signs and symptoms of potential                            delayed complications were discussed with the                            patient. Return to normal activities tomorrow.                            Written discharge instructions were provided to the                            patient.                           - Resume previous diet.                           - Continue present medications.                           - Await pathology results.                           - Repeat colonoscopy date to be determined after                            pending pathology results are reviewed for                            surveillance.                           - Emerging evidence supports eating a diet of                            fruits, vegetables, grains, calcium, and yogurt                            while reducing red meat and alcohol may reduce the                            risk of colon cancer.  Tressia DanasKimberly Marcene Laskowski MD, MD 04/23/2022 10:59:20 AM This report has been signed electronically.

## 2022-04-23 NOTE — Progress Notes (Signed)
Called to room to assist during endoscopic procedure.  Patient ID and intended procedure confirmed with present staff. Received instructions for my participation in the procedure from the performing physician.  

## 2022-04-23 NOTE — Telephone Encounter (Signed)
Patient has been scheduled for f/u with Gunnar Fusi, NP on 05/20/22 at 9:00 am. Pt notified via mychart.

## 2022-04-23 NOTE — Telephone Encounter (Signed)
-----   Message from Tressia Danas, MD sent at 04/23/2022 10:46 AM EDT ----- Please schedule office visit with me or an APP - whoever has the first available appointment. Thanks.  KLB

## 2022-04-23 NOTE — Patient Instructions (Signed)
YOU HAD AN ENDOSCOPIC PROCEDURE TODAY AT THE [facility]:   Refer to the procedure report that was given to you for any specific questions about what was found during the examination.  If the procedure report does not answer your questions, please call your gastroenterologist to clarify.  If you requested that your care partner not be given the details of your procedure findings, then the procedure report has been included in a sealed envelope for you to review at your convenience later.  YOU SHOULD EXPECT: Some feelings of bloating in the abdomen. Passage of more gas than usual.  Walking can help get rid of the air that was put into your GI tract during the procedure and reduce the bloating. If you had a lower endoscopy (such as a colonoscopy or flexible sigmoidoscopy) you may notice spotting of blood in your stool or on the toilet paper. If you underwent a bowel prep for your procedure, you may not have a normal bowel movement for a few days.  Please Note:  You might notice some irritation and congestion in your nose or some drainage.  This is from the oxygen used during your procedure.  There is no need for concern and it should clear up in a day or so.  SYMPTOMS TO REPORT IMMEDIATELY:  Following lower endoscopy (colonoscopy or flexible sigmoidoscopy):  Excessive amounts of blood in the stool  Significant tenderness or worsening of abdominal pains  Swelling of the abdomen that is new, acute  Fever of 100F or higher  For urgent or emergent issues, a gastroenterologist can be reached at any hour by calling [phone]. Do not use MyChart messaging for urgent concerns.    DIET:  We do recommend a small meal at first, but then you may proceed to your regular diet.  Drink plenty of fluids but you should avoid alcoholic beverages for 24 hours.  ACTIVITY:  You should plan to take it easy for the rest of today and you should NOT DRIVE or use heavy machinery until tomorrow (because of  the sedation medicines used during the test).    FOLLOW UP: Our staff will call the number listed on your records the next business day following your procedure.  We will call around 7:15- 8:00 am to check on you and address any questions or concerns that you may have regarding the information given to you following your procedure. If we do not reach you, we will leave a message.  If you develop any symptoms (ie: fever, flu-like symptoms, shortness of breath, cough etc.) before then, please call [phone].  If you test positive for Covid 19 in the 2 weeks post procedure, please call and report this information to us.    If any biopsies were taken you will be contacted by phone or by letter within the next 1-3 weeks.  Please call us at [phone] if you have not heard about the biopsies in 3 weeks.    SIGNATURES/CONFIDENTIALITY: You and/or your care partner have signed paperwork which will be entered into your electronic medical record.  These signatures attest to the fact that that the information above on your After Visit Summary has been reviewed and is understood.  Full responsibility of the confidentiality of this discharge information lies with you and/or your care-partner.  

## 2022-04-23 NOTE — Progress Notes (Signed)
Referring Provider: Jac Canavan, PA-C Primary Care Physician:  Jac Canavan, PA-C  Indication for Procedure: Diarrhea  IMPRESSION:  Diarrhea Appropriate candidate for monitored anesthesia care  PLAN: Colonoscopy in the [facility] today   HPI: Devin Nunez is a 61 y.o. male presents for diagnostic colonoscopy to evaluate diarrhea.  Developed diarrhea 12-14 weeks ago. Occurring 3 times a week. Associated urgency. Stools are liquid but no incontinence.  Lower abdominal pain frequently precedes defecation.  No blood or mucous in the stool. Has increased the fiber in his diet and has had fiber gummies without significant change.    No recent labs or stool studies performed to evaluate his symptoms.    Denies a precipitating event, trauma, close contacts with similar symptoms, changes in diet, recent travel or antibiotic use.     Colonoscopy in 2015 with Dr. Loreta Ave. Normal colonoscopy. No diarrhea occurring at that time.    No prior abdominal imaging. CT pelvis 2021 to evaluate right inguinal pain showed small bilateral fat containing inguinal hernias.    Normal CMP and CBC 06/10/21   First cousin with a rare kind of  colon cancer at age 68. There is no known family history of colon cancer or polyps. No family history of stomach cancer or other GI malignancy. No family history of inflammatory bowel disease or celiac.      Past Medical History:  Diagnosis Date   Elevated PSA 2008   hx/o with benign biopsy   HLD (hyperlipidemia)    Hordeolum 2015   Dr. Dione Booze, prior Doxycycline therapy   PVC (premature ventricular contraction)    Rosacea    takes Doxycycline every other day, uses facial creams;  [facility]   Shingles 2008   chest/back   Wisdom teeth removed     Past Surgical History:  Procedure Laterality Date   COLONOSCOPY  04/2014   normal colonoscopy, Dr. Charmian Muff HERNIA REPAIR Right 06/14/2020   Procedure: HERNIA REPAIR INGUINAL ADULT RIGHT WITH MESH;   Surgeon: Griselda Miner, MD;  [facility];  Service: General;  Laterality: Right;  GENERAL AND TAP BLOCK   PROSTATE BIOPSY  2008   reportedly benign   WISDOM TOOTH EXTRACTION      Current Outpatient Medications  Medication Sig Dispense Refill   finasteride (PROPECIA) 1 MG tablet Take 1 mg by mouth daily.     rosuvastatin (CRESTOR) 10 MG tablet Take 1 tablet (10 mg total) by mouth daily. 90 tablet 3   Current Facility-Administered Medications  Medication Dose Route Frequency Provider Last Rate Last Admin   0.9 %  sodium chloride infusion  500 mL Intravenous Once Tressia Danas, MD        Allergies as of 04/23/2022   (No Known Allergies)    Family History  Problem Relation Age of Onset   Other Mother        died 16 of old age   Heart disease Father 24       MI, CABG   Heart attack Father    Colon cancer Cousin 46   Diabetes Neg Hx    Hypertension Neg Hx    Stroke Neg Hx      Physical Exam: General:   Alert,  well-nourished, pleasant and cooperative in NAD Head:  Normocephalic and atraumatic. Eyes:  Sclera clear, no icterus.   Conjunctiva pink. Mouth:  No deformity or lesions.   Neck:  Supple; no masses or thyromegaly. Lungs:  Clear throughout to auscultation.  No wheezes. Heart:  Regular rate and rhythm; no murmurs. Abdomen:  Soft, non-tender, nondistended, normal bowel sounds, no rebound or guarding.  Msk:  Symmetrical. No boney deformities LAD: No inguinal or umbilical LAD Extremities:  No clubbing or edema. Neurologic:  Alert and  oriented x4;  grossly nonfocal Skin:  No obvious rash or bruise. Psych:  Alert and cooperative. Normal mood and affect.     Studies/Results: No results found.    Merica Prell L. Orvan Falconer, MD, MPH 04/23/2022, 10:20 AM

## 2022-04-24 ENCOUNTER — Telehealth: Payer: Self-pay

## 2022-04-24 NOTE — Telephone Encounter (Signed)
Follow up call placed, VM obtained and message left. ?SChaplin, RN,BSN ? ?

## 2022-04-29 NOTE — Telephone Encounter (Signed)
Left detailed message for patient regarding f/u visit & to call back if he'd like to reschedule.

## 2022-05-01 ENCOUNTER — Other Ambulatory Visit: Payer: Self-pay

## 2022-05-01 ENCOUNTER — Telehealth: Payer: Self-pay

## 2022-05-01 DIAGNOSIS — R197 Diarrhea, unspecified: Secondary | ICD-10-CM

## 2022-05-01 MED ORDER — COLESTIPOL HCL 1 G PO TABS: 1.0000 g | ORAL_TABLET | Freq: Every morning | ORAL | 0 refills | Status: DC

## 2022-05-01 NOTE — Telephone Encounter (Signed)
Patient returned call and was given results & recommendations from recent path. Patient would like to proceed with CT & colestipol as recommended. CT ordered & schedulers notified. Medication sent to pharmacy. Patient is aware of appointment with Gunnar Fusi, NP, and plans to attend.

## 2022-05-06 ENCOUNTER — Telehealth: Payer: Self-pay

## 2022-05-06 NOTE — Telephone Encounter (Signed)
Received call from [facility] requesting that CT order be faxed to [facility] ([facility] [phone]. Orders faxed.

## 2022-05-12 ENCOUNTER — Ambulatory Visit (HOSPITAL_COMMUNITY): Payer: BC Managed Care – PPO

## 2022-05-12 DIAGNOSIS — R197 Diarrhea, unspecified: Secondary | ICD-10-CM | POA: Diagnosis not present

## 2022-05-12 DIAGNOSIS — N281 Cyst of kidney, acquired: Secondary | ICD-10-CM | POA: Diagnosis not present

## 2022-05-12 DIAGNOSIS — K388 Other specified diseases of appendix: Secondary | ICD-10-CM | POA: Diagnosis not present

## 2022-05-20 ENCOUNTER — Ambulatory Visit: Payer: BC Managed Care – PPO | Admitting: Nurse Practitioner

## 2022-05-20 ENCOUNTER — Encounter: Payer: Self-pay | Admitting: Nurse Practitioner

## 2022-05-20 VITALS — BP 142/94 | HR 92 | Ht 72.0 in | Wt 209.0 lb

## 2022-05-20 DIAGNOSIS — R197 Diarrhea, unspecified: Secondary | ICD-10-CM

## 2022-05-20 NOTE — Progress Notes (Signed)
Chief Complaint:  follow up on colonoscopy , diarrhea.    Assessment &  Plan   # 61 year old male with intermittent diarrhea / lower abdominal pain since March.  Pain relieved with defecation. Possibly IBS.  He tells me that his bowel movements have been normal 90% of the time since April.  Multiple labs including thyroid studies and celiac serologies were unremarkable.  Colonoscopy with random biopsies and ileal biopsies unremarkable.  He started colestipol a week ago and his stools are becoming hard.  Since the majority of his time his bowel movements are normal I think he is going to get constipated on the colestipol.  He will continue to try and correlate episodes of diarrhea with diet Follow-up as needed   # Abnormal appendix. Dilated appendix on CT scan. Colonoscopy showed prominent appendiceal orifice.  Biopsies compatible with showed mild chronic inflammation   # Large renal cyst, incidental finding on CT scan.  Patient has an appointment with his PCP in a few weeks and plans to discuss findings with him   HPI    Dreon was seen by Dr. Tarri Glenn in late May of this year for evaluation of chronic diarrhea which started in March.  Multiple labs and stool studies were obtained.  He was scheduled for colonoscopy.   Fecal calprotectin, giardia, celiac serologies, ESR, CRP, TSH were all normal. Colonoscopy was unremarkable except for submucosal prominence of the appendiceal orifice which was biopsied.  The examined portion of the ileum (5 cm) was normal appearing.  Random colon biopsies and ileal biopsies were unremarkable.  Appendiceal orifice biopsy showed focal submucosal soft tissue with adipose tissue and mild edema with mild mononuclear/chronic inflammatory infiltrate which was overall nonspecific.  No malignancy seen.    Following the colonoscopy a CT scan of the abdomen and pelvis was recommended to further evaluate the appendix.  Trial of colestipol was recommended.  He got the  CT scan done at [facility] Dr. Tarri Glenn reviewed result.  The appendix was dilated but no evidence for appendicitis. A large renal cyst was seen. He was given an appointment to follow up with me.   Interval History:   The diarrhea is intermittent and sometimes associated with lower abdominal pain. The pain is relieved with defecation. His BMs are normal 90% of the time. He cannot relate the episodes of diarrhea to anything he eats. Started Colestipol a week ago and stools are getting hard.    Previous GI Evaluation   June 2023 colonoscopy -unremarkable except for submucosal prominence of the appendiceal orifice which was biopsied.  The examined portion of the ileum (5 cm) was normal appearing. Surveillance colonoscopy recommended for 10 years Surgical [P], colon, appendical orifice - COLONIC MUCOSA WITHIN NORMAL LIMITS WITH FOCAL SUBMUCOSAL SOFT TISSUE WITH ADIPOSE TISSUE AND MILD EDEMA WITH MILD MONONUCLEAR/CHRONIC INFLAMMATORY INFILTRATE, OVERALL NONSPECIFIC. - NEGATIVE FOR MALIGNANCY. 2. Surgical [P], colon, TI SMALL INTESTINAL MUCOSA WITHIN NORMAL LIMITS. 3. Surgical [P], colon nos, random sites - COLONIC MUCOSA WITHIN NORMAL LIMITS   Labs:     Latest Ref Rng & Units 06/10/2021    1:31 PM 06/12/2020    9:05 AM 02/20/2020    9:23 AM  CBC  WBC 3.4 - 10.8 x10E3/uL 6.6  5.0  5.9   Hemoglobin 13.0 - 17.7 g/dL 14.9  15.3  16.8   Hematocrit 37.5 - 51.0 % 44.1  47.2  48.8   Platelets 150 - 450 x10E3/uL 255  246  289  Latest Ref Rng & Units 06/10/2021    1:31 PM 06/12/2020    9:05 AM 02/20/2020    9:23 AM  Hepatic Function  Total Protein 6.0 - 8.5 g/dL 6.8  7.2  7.6   Albumin 3.8 - 4.8 g/dL 4.4  4.0  4.8   AST 0 - 40 IU/L 20  22  24    ALT 0 - 44 IU/L 15  22  16    Alk Phosphatase 44 - 121 IU/L 60  47  64   Total Bilirubin 0.0 - 1.2 mg/dL 0.8  0.5  0.8      Past Medical History:  Diagnosis Date   Elevated PSA 2008   hx/o with benign biopsy   HLD (hyperlipidemia)    Hordeolum  2015   Dr. Katy Fitch, prior Doxycycline therapy   PVC (premature ventricular contraction)    Rosacea    takes Doxycycline every other day, uses facial creams;  [facility]   Shingles 2008   chest/back   Wisdom teeth removed     Past Surgical History:  Procedure Laterality Date   COLONOSCOPY  04/2014   normal colonoscopy, Dr. Neville Route HERNIA REPAIR Right 06/14/2020   Procedure: HERNIA REPAIR INGUINAL ADULT RIGHT WITH MESH;  Surgeon: Jovita Kussmaul, MD;  Location: [facility];  Service: General;  Laterality: Right;  GENERAL AND TAP BLOCK   PROSTATE BIOPSY  2008   reportedly benign   WISDOM TOOTH EXTRACTION      Current Medications, Allergies, Family History and Social History were reviewed in [facility] record.     Current Outpatient Medications  Medication Sig Dispense Refill   colestipol (COLESTID) 1 g tablet Take 1 tablet (1 g total) by mouth in the morning. May increase to twice a day after one week if not experiencing constipation. 60 tablet 0   finasteride (PROPECIA) 1 MG tablet Take 1 mg by mouth daily.     rosuvastatin (CRESTOR) 10 MG tablet Take 1 tablet (10 mg total) by mouth daily. 90 tablet 3   No current facility-administered medications for this visit.    Review of Systems: No chest pain. No shortness of breath. No urinary complaints.    Physical Exam  Wt Readings from Last 3 Encounters:  04/23/22 213 lb (96.6 kg)  03/20/22 213 lb 2 oz (96.7 kg)  02/06/22 210 lb (95.3 kg)    BP (!) 142/94   Pulse 92   Ht 6' (1.829 m)   Wt 209 lb (94.8 kg)   BMI 28.35 kg/m  Constitutional:  Generally well appearing male in no acute distress. Psychiatric: Pleasant. Normal mood and affect. Behavior is normal. EENT: Pupils normal.  Conjunctivae are normal. No scleral icterus. Neck supple.  Cardiovascular: Normal rate, regular rhythm. No edema Pulmonary/chest: Effort normal and breath sounds normal. No wheezing, rales or rhonchi. Abdominal:  Soft, nondistended, nontender. Bowel sounds active throughout. There are no masses palpable. No hepatomegaly. Neurological: Alert and oriented to person place and time. Skin: Skin is warm and dry. No rashes noted.  Tye Savoy, NP  05/20/2022, 8:37 AM  Cc:  Carlena Hurl, PA-C

## 2022-05-20 NOTE — Patient Instructions (Signed)
STOP Cholestyramine  Follow up as needed  If you are age 62 or older, your body mass index should be between 23-30. Your Body mass index is 28.35 kg/m. If this is out of the aforementioned range listed, please consider follow up with your Primary Care Provider.  If you are age 26 or younger, your body mass index should be between 19-25. Your Body mass index is 28.35 kg/m. If this is out of the aformentioned range listed, please consider follow up with your Primary Care Provider.   ________________________________________________________  The [facility] providers would like to encourage you to use [facility] to communicate with providers for non-urgent requests or questions.  Due to long hold times on the telephone, sending your provider a message by [facility] may be a faster and more efficient way to get a response.  Please allow 48 business hours for a response.  Please remember that this is for non-urgent requests.  _______________________________________________________   I appreciate the  opportunity to care for you  Thank You   Midge Minium

## 2022-05-25 ENCOUNTER — Other Ambulatory Visit: Payer: Self-pay | Admitting: Gastroenterology

## 2022-05-28 NOTE — Progress Notes (Signed)
Reviewed and agree with management plans. ? ?Conna Terada L. Whittany Parish, MD, MPH  ?

## 2022-06-01 ENCOUNTER — Other Ambulatory Visit: Payer: Self-pay | Admitting: Medical

## 2022-06-12 ENCOUNTER — Encounter: Payer: Self-pay | Admitting: Medical

## 2022-06-12 ENCOUNTER — Ambulatory Visit (INDEPENDENT_AMBULATORY_CARE_PROVIDER_SITE_OTHER): Payer: BC Managed Care – PPO | Admitting: Medical

## 2022-06-12 VITALS — BP 122/70 | HR 78 | Ht 72.75 in | Wt 210.8 lb

## 2022-06-12 DIAGNOSIS — Z0000 Encounter for general adult medical examination without abnormal findings: Secondary | ICD-10-CM

## 2022-06-12 DIAGNOSIS — R195 Other fecal abnormalities: Secondary | ICD-10-CM | POA: Insufficient documentation

## 2022-06-12 DIAGNOSIS — E785 Hyperlipidemia, unspecified: Secondary | ICD-10-CM

## 2022-06-12 DIAGNOSIS — Z800 Family history of malignant neoplasm of digestive organs: Secondary | ICD-10-CM | POA: Diagnosis not present

## 2022-06-12 DIAGNOSIS — Z9889 Other specified postprocedural states: Secondary | ICD-10-CM

## 2022-06-12 DIAGNOSIS — Z7185 Encounter for immunization safety counseling: Secondary | ICD-10-CM

## 2022-06-12 DIAGNOSIS — Z125 Encounter for screening for malignant neoplasm of prostate: Secondary | ICD-10-CM

## 2022-06-12 DIAGNOSIS — L989 Disorder of the skin and subcutaneous tissue, unspecified: Secondary | ICD-10-CM

## 2022-06-12 DIAGNOSIS — N281 Cyst of kidney, acquired: Secondary | ICD-10-CM | POA: Insufficient documentation

## 2022-06-12 LAB — POCT URINALYSIS DIP (PROADVANTAGE DEVICE)
Bilirubin, UA: NEGATIVE
Blood, UA: NEGATIVE
Glucose, UA: NEGATIVE mg/dL
Leukocytes, UA: NEGATIVE
Nitrite, UA: NEGATIVE
Protein Ur, POC: NEGATIVE mg/dL
Specific Gravity, Urine: 1.03
Urobilinogen, Ur: NEGATIVE
pH, UA: 6 (ref 5.0–8.0)

## 2022-06-12 NOTE — Patient Instructions (Signed)
Consider a daily probiotic such as Align, IB Guard, Acidophilus.  Consider Kombucha  Consider cutting back on alcohol for the time being to see if this helps

## 2022-06-12 NOTE — Progress Notes (Signed)
Subjective:   HPI  Devin Nunez is a 61 y.o. male who presents for Chief Complaint  Patient presents with   fasting cpe    Fasting cpe, discuss CT scan, about kidney cyst    Patient Care Team: Danta [facility], Cleda Mccreedy as PCP - General (Family Medicine) Sees dentist Sees eye doctor, Dr. Heinz Knuckles Dermatology Dr. Tressia Danas, GI   Concerns: He saw GI within the past year regarding diarrhea.  End of having his scan and colonoscopy.  He never found a real good source of the diarrhea but he still gets loose stools on average 3 days/month.  Is somewhat annoying but not debilitating.  He wants some advice on what to do with this.  On the scan he had they mention a right renal cyst.  He needs know what to do about the cyst.  Reviewed their medical, surgical, family, social, medication, and allergy history and updated chart as appropriate.  Past Medical History:  Diagnosis Date   Elevated PSA 2008   hx/o with benign biopsy   HLD (hyperlipidemia)    Hordeolum 2015   Dr. Dione Booze, prior Doxycycline therapy   PVC (premature ventricular contraction)    Rosacea    takes Doxycycline every other day, uses facial creams;  [facility]   Shingles 2008   chest/back   Wisdom teeth removed     Past Surgical History:  Procedure Laterality Date   COLONOSCOPY  04/2014   normal colonoscopy, Dr. Charmian Muff HERNIA REPAIR Right 06/14/2020   Procedure: HERNIA REPAIR INGUINAL ADULT RIGHT WITH MESH;  Surgeon: Griselda Miner, MD;  Location: [facility] OR;  Service: General;  Laterality: Right;  GENERAL AND TAP BLOCK   PROSTATE BIOPSY  2008   reportedly benign   WISDOM TOOTH EXTRACTION      Family History  Problem Relation Age of Onset   Other Mother        died 81 of old age   Heart disease Father 71       MI, CABG   Heart attack Father    Colon cancer Cousin 34   Diabetes Neg Hx    Hypertension Neg Hx    Stroke Neg Hx      Current Outpatient Medications:    finasteride  (PROPECIA) 1 MG tablet, Take 1 mg by mouth daily., Disp: , Rfl:    rosuvastatin (CRESTOR) 10 MG tablet, TAKE 1 TABLET BY MOUTH EVERY DAY, Disp: 90 tablet, Rfl: 1  No Known Allergies   Review of Systems Constitutional: -fever, -chills, -sweats, -unexpected weight change, -decreased appetite, -fatigue Allergy: -sneezing, -itching, -congestion Dermatology: -changing moles, --rash, -lumps ENT: -runny nose, -ear pain, -sore throat, -hoarseness, -sinus pain, -teeth pain, - ringing in ears, -hearing loss, -nosebleeds Cardiology: -chest pain, -palpitations, -swelling, -difficulty breathing when lying flat, -waking up short of breath Respiratory: -cough, -shortness of breath, -difficulty breathing with exercise or exertion, -wheezing, -coughing up blood Gastroenterology: -abdominal pain, -nausea, -vomiting, +diarrhea, -constipation, -blood in stool, -changes in bowel movement, -difficulty swallowing or eating Hematology: -bleeding, -bruising  Musculoskeletal: -joint aches, -muscle aches, -joint swelling, -back pain, -neck pain, -cramping, -changes in gait Ophthalmology: denies vision changes, eye redness, itching, discharge Urology: -burning with urination, -difficulty urinating, -blood in urine, -urinary frequency, -urgency, -incontinence Neurology: -headache, -weakness, -tingling, -numbness, -memory loss, -falls, -dizziness Psychology: -depressed mood, -agitation, -sleep problems Male GU: no testicular mass, pain, no lymph nodes swollen, no swelling, no rash.     06/12/2022    2:45 PM  06/10/2021   12:26 PM 02/20/2020    8:32 AM 10/30/2016    8:48 AM  Depression screen PHQ 2/9  Decreased Interest 0 0 0 0  Down, Depressed, Hopeless 0 0 0 0  PHQ - 2 Score 0 0 0 0        Objective:  BP 122/70   Pulse 78   Ht 6' 0.75" (1.848 m)   Wt 210 lb 12.8 oz (95.6 kg)   BMI 28.00 kg/m   General appearance: alert, no distress, WD/WN, Caucasian male Skin: scattered macules, 1 lesion of left cheek  approx 1cm diameter, irregular border, pink/flesh colored, otherwise unremarkable HEENT: normocephalic, conjunctiva/corneas normal, sclerae anicteric, PERRLA, EOMi, nares patent, no discharge or erythema, pharynx normal Oral cavity: MMM, tongue normal, teeth normal Neck: supple, no lymphadenopathy, no thyromegaly, no masses, normal ROM, no bruits Chest: non tender, normal shape and expansion Heart: RRR, normal S1, S2, no murmurs Lungs: CTA bilaterally, no wheezes, rhonchi, or rales Abdomen: +bs, soft, non tender, non distended, no masses, no hepatomegaly, no splenomegaly, no bruits Back: non tender, normal ROM, no scoliosis Musculoskeletal: upper extremities non tender, no obvious deformity, normal ROM throughout, lower extremities non tender, no obvious deformity, normal ROM throughout Extremities: no edema, no cyanosis, no clubbing Pulses: 2+ symmetric, upper and lower extremities, normal cap refill Neurological: alert, oriented x 3, CN2-12 intact, strength normal upper extremities and lower extremities, sensation normal throughout, DTRs 2+ throughout, no cerebellar signs, gait normal Psychiatric: normal affect, behavior normal, pleasant  GU: right inguinal hernia surgical scar, normal male external genitalia,circumcised, nontender, no masses, no hernia, no lymphadenopathy Rectal: declined   Assessment and Plan :   Encounter Diagnoses  Name Primary?   Routine general medical examination at a health care facility Yes   Encounter for health maintenance examination in adult    Family history of colon cancer    History of prostate biopsy    Hyperlipidemia, unspecified hyperlipidemia type    Screening for prostate cancer    Vaccine counseling    Renal cyst    Loose stools    Skin lesion     This visit was a preventative care visit, also known as wellness visit or routine physical.   Topics typically include healthy lifestyle, diet, exercise, preventative care, vaccinations, sick and  well care, proper use of emergency dept and after hours care, as well as other concerns.     Recommendations: Continue to return yearly for your annual wellness and preventative care visits.  This gives [place] a chance to discuss healthy lifestyle, exercise, vaccinations, review your chart record, and perform screenings where appropriate.  I recommend you see your eye doctor yearly for routine vision care.  I recommend you see your dentist yearly for routine dental care including hygiene visits twice yearly.   Vaccination recommendations were reviewed Immunization History  Administered Date(s) Administered   PFIZER(Purple Top)SARS-COV-2 Vaccination 12/30/2019, 01/20/2020   Tdap 02/15/2014, 09/15/2018   Tetanus 09/15/2018   Zoster Recombinat (Shingrix) 12/16/2021, 02/18/2022   I recommend yearly flu shot in the fall  Screening for cancer: Colon cancer screening: I reviewed your colonoscopy on file that is up to date from 2023  We discussed PSA, prostate exam, and prostate cancer screening risks/benefits.     Skin cancer screening: Check your skin regularly for new changes, growing lesions, or other lesions of concern Come in for evaluation if you have skin lesions of concern.  Lung cancer screening: If you have a greater than 20 pack year  history of tobacco use, then you may qualify for lung cancer screening with a chest CT scan.   Please call your insurance company to inquire about coverage for this test.  We currently don't have screenings for other cancers besides breast, cervical, colon, and lung cancers.  If you have a strong family history of cancer or have other cancer screening concerns, please let me know.    Bone health: Get at least 150 minutes of aerobic exercise weekly Get weight bearing exercise at least once weekly Bone density test:  A bone density test is an imaging test that uses a type of X-ray to measure the amount of calcium and other minerals in your  bones. The test may be used to diagnose or screen you for a condition that causes weak or thin bones (osteoporosis), predict your risk for a broken bone (fracture), or determine how well your osteoporosis treatment is working. The bone density test is recommended for females 65 and older, or females or males <65 if certain risk factors such as thyroid disease, long term use of steroids such as for asthma or rheumatological issues, vitamin D deficiency, estrogen deficiency, family history of osteoporosis, self or family history of fragility fracture in first degree relative.    Heart health: Get at least 150 minutes of aerobic exercise weekly Limit alcohol It is important to maintain a healthy blood pressure and healthy cholesterol numbers  Heart disease screening: Screening for heart disease includes screening for blood pressure, fasting lipids, glucose/diabetes screening, BMI height to weight ratio, reviewed of smoking status, physical activity, and diet.    Goals include blood pressure 120/80 or less, maintaining a healthy lipid/cholesterol profile, preventing diabetes or keeping diabetes numbers under good control, not smoking or using tobacco products, exercising most days per week or at least 150 minutes per week of exercise, and eating healthy variety of fruits and vegetables, healthy oils, and avoiding unhealthy food choices like fried food, fast food, high sugar and high cholesterol foods.    Other tests may possibly include EKG test, CT coronary calcium score, echocardiogram, exercise treadmill stress test.   I reviewed the transthoracic echocardiogram done Mar 21, 2014 showing normal LVEF to 55%, mild to moderate mitral regurgitation, slight left atrial enlargement, Dr. Viann Fish  CT coronary calcium score/2/22: IMPRESSION: Total Agatston score: 331  [place] database percentile: 84  Aortic root atherosclerosis.  Old granulomatous disease.  No acute findings.    Medical care  options: I recommend you continue to seek care here first for routine care.  We try really hard to have available appointments Monday through Friday daytime hours for sick visits, acute visits, and physicals.  Urgent care should be used for after hours and weekends for significant issues that cannot wait till the next day.  The emergency department should be used for significant potentially life-threatening emergencies.  The emergency department is expensive, can often have long wait times for less significant concerns, so try to utilize primary care, urgent care, or telemedicine when possible to avoid unnecessary trips to the emergency department.  Virtual visits and telemedicine have been introduced since the pandemic started in 2020, and can be convenient ways to receive medical care.  We offer virtual appointments as well to assist you in a variety of options to seek medical care.   Advanced Directives: I recommend you consider completing a Health Care Power of Attorney and Living Will.   These documents respect your wishes and help alleviate burdens on your loved ones  if you were to become terminally ill or be in a position to need those documents enforced.    You can complete Advanced Directives yourself, have them notarized, then have copies made for our office, for you and for anybody you feel should have them in safe keeping.  Or, you can have an attorney prepare these documents.   If you haven't updated your Last Will and Testament in a while, it may be worthwhile having an attorney prepare these documents together and save on some costs.       Separate significant issues discussed: Hyperlipidemia - on statin.  Labs today  We discussed his loose stool concerns.  Although they are not real frequent he is concerned about this.  He will start a probiotic.  If this does not help we may do a round of antibiotic such as Flagyl or Augmentin or even consider off label use of Xifaxan similar to IBS  treatment protocol.  Can use Imodium as needed.  I reviewed the GI notes scan and colonoscopy from 2023  Renal cyst-referral to urology for consult  We will work to get him back into dermatology for skin lesions particularly on his left cheek   Aditya was seen today for fasting cpe.  Diagnoses and all orders for this visit:  Routine general medical examination at a health care facility -     POCT Urinalysis DIP (Proadvantage Device) -     Comprehensive metabolic panel -     CBC -     PSA, total and free -     Lipid panel  Encounter for health maintenance examination in adult  Family history of colon cancer  History of prostate biopsy  Hyperlipidemia, unspecified hyperlipidemia type -     Lipid panel  Screening for prostate cancer -     PSA, total and free  Vaccine counseling  Renal cyst -     Ambulatory referral to Urology  Loose stools  Skin lesion     Follow-up pending labs, yearly for physical

## 2022-06-13 LAB — COMPREHENSIVE METABOLIC PANEL
ALT: 19 IU/L (ref 0–44)
AST: 26 IU/L (ref 0–40)
Albumin/Globulin Ratio: 1.5 (ref 1.2–2.2)
Albumin: 4.4 g/dL (ref 3.9–4.9)
Alkaline Phosphatase: 56 IU/L (ref 44–121)
BUN/Creatinine Ratio: 13 (ref 10–24)
BUN: 13 mg/dL (ref 8–27)
Bilirubin Total: 0.5 mg/dL (ref 0.0–1.2)
CO2: 23 mmol/L (ref 20–29)
Calcium: 9.3 mg/dL (ref 8.6–10.2)
Chloride: 101 mmol/L (ref 96–106)
Creatinine, Ser: 0.97 mg/dL (ref 0.76–1.27)
Globulin, Total: 3 g/dL (ref 1.5–4.5)
Glucose: 98 mg/dL (ref 70–99)
Potassium: 4 mmol/L (ref 3.5–5.2)
Sodium: 139 mmol/L (ref 134–144)
Total Protein: 7.4 g/dL (ref 6.0–8.5)
eGFR: 88 mL/min/{1.73_m2} (ref >59)

## 2022-06-13 LAB — CBC
Hematocrit: 46.2 % (ref 37.5–51.0)
Hemoglobin: 15 g/dL (ref 13.0–17.7)
MCH: 30.1 pg (ref 26.6–33.0)
MCHC: 32.5 g/dL (ref 31.5–35.7)
MCV: 93 fL (ref 79–97)
Platelets: 251 10*3/uL (ref 150–450)
RBC: 4.98 x10E6/uL (ref 4.14–5.80)
RDW: 12.1 % (ref 11.6–15.4)
WBC: 7.3 10*3/uL (ref 3.4–10.8)

## 2022-06-13 LAB — LIPID PANEL
Chol/HDL Ratio: 2.6 ratio (ref 0.0–5.0)
Cholesterol, Total: 191 mg/dL (ref 100–199)
HDL: 74 mg/dL (ref >39)
LDL Chol Calc (NIH): 93 mg/dL (ref 0–99)
Triglycerides: 142 mg/dL (ref 0–149)
VLDL Cholesterol Cal: 24 mg/dL (ref 5–40)

## 2022-06-13 LAB — PSA, TOTAL AND FREE
PSA, Free Pct: 27.5 %
PSA, Free: 0.22 ng/mL
Prostate Specific Ag, Serum: 0.8 ng/mL (ref 0.0–4.0)

## 2022-06-17 NOTE — Addendum Note (Signed)
Addended by: Herminio Commons A on: 06/17/2022 08:54 AM   Modules accepted: Orders

## 2022-07-01 ENCOUNTER — Encounter: Payer: Self-pay | Admitting: Internal Medicine

## 2022-08-04 ENCOUNTER — Encounter: Payer: Self-pay | Admitting: Internal Medicine

## 2022-08-18 DIAGNOSIS — L814 Other melanin hyperpigmentation: Secondary | ICD-10-CM | POA: Diagnosis not present

## 2022-08-18 DIAGNOSIS — D225 Melanocytic nevi of trunk: Secondary | ICD-10-CM | POA: Diagnosis not present

## 2022-08-18 DIAGNOSIS — L821 Other seborrheic keratosis: Secondary | ICD-10-CM | POA: Diagnosis not present

## 2022-09-01 DIAGNOSIS — H02054 Trichiasis without entropian left upper eyelid: Secondary | ICD-10-CM | POA: Diagnosis not present

## 2022-09-01 DIAGNOSIS — H5712 Ocular pain, left eye: Secondary | ICD-10-CM | POA: Diagnosis not present

## 2022-09-21 DIAGNOSIS — H0102B Squamous blepharitis left eye, upper and lower eyelids: Secondary | ICD-10-CM | POA: Diagnosis not present

## 2022-09-21 DIAGNOSIS — H04123 Dry eye syndrome of bilateral lacrimal glands: Secondary | ICD-10-CM | POA: Diagnosis not present

## 2022-09-21 DIAGNOSIS — H2513 Age-related nuclear cataract, bilateral: Secondary | ICD-10-CM | POA: Diagnosis not present

## 2022-09-21 DIAGNOSIS — Z9889 Other specified postprocedural states: Secondary | ICD-10-CM | POA: Diagnosis not present

## 2022-10-26 HISTORY — PX: COLONOSCOPY: SHX174

## 2022-11-03 ENCOUNTER — Encounter: Payer: Self-pay | Admitting: Internal Medicine

## 2022-11-27 ENCOUNTER — Other Ambulatory Visit: Payer: Self-pay | Admitting: Medical

## 2023-01-20 ENCOUNTER — Telehealth: Payer: 59 | Admitting: Physician Assistant

## 2023-01-20 DIAGNOSIS — M545 Low back pain, unspecified: Secondary | ICD-10-CM | POA: Diagnosis not present

## 2023-01-20 MED ORDER — NAPROXEN 500 MG PO TABS: 500.0000 mg | ORAL_TABLET | Freq: Two times a day (BID) | ORAL | 0 refills | Status: DC

## 2023-01-20 MED ORDER — CYCLOBENZAPRINE HCL 10 MG PO TABS: 10.0000 mg | ORAL_TABLET | Freq: Three times a day (TID) | ORAL | 0 refills | Status: DC | PRN

## 2023-01-20 NOTE — Progress Notes (Signed)

## 2023-01-20 NOTE — Progress Notes (Signed)
I have spent 5 minutes in review of e-visit questionnaire, review and updating patient chart, medical decision making and response to patient.   Anaisha Mago Cody Martavius Lusty, PA-C    

## 2023-02-05 IMAGING — CT CT CARDIAC CORONARY ARTERY CALCIUM SCORE
3 series · 14 of 20 positions shown, 16 images · non-contrast
Comparison: None.

CLINICAL DATA: Hyperlipidemia

EXAM:
CT CARDIAC CORONARY ARTERY CALCIUM SCORE
TECHNIQUE: Non-contrast imaging through the heart was performed using
prospective ECG gating. Image post processing was performed on an
independent workstation, allowing for quantitative analysis of the
heart and coronary arteries. Note that this exam targets the heart
and the chest was not imaged in its entirety.

[Series 2: calcium scoring 2.00 qr36 bestdiast 70% hrt calciu · axial · 0.35mm/px · z∈[+1706,+1782]mm · 4 of 64 slices shown]
[im 13/64  vessel]
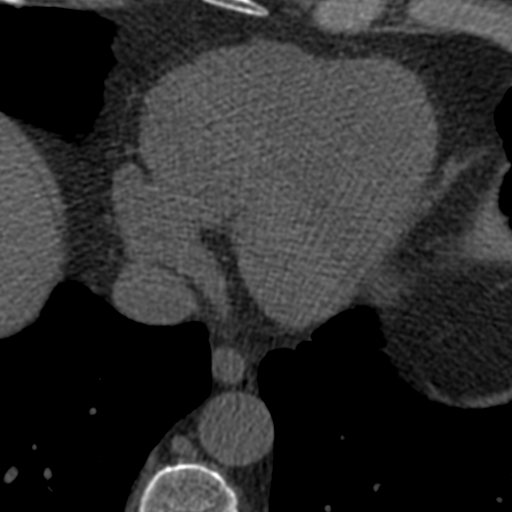
[im 26/64  vessel]
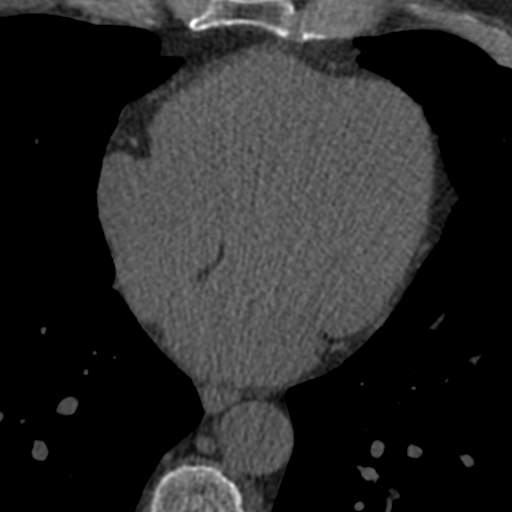
[im 38/64  vessel]
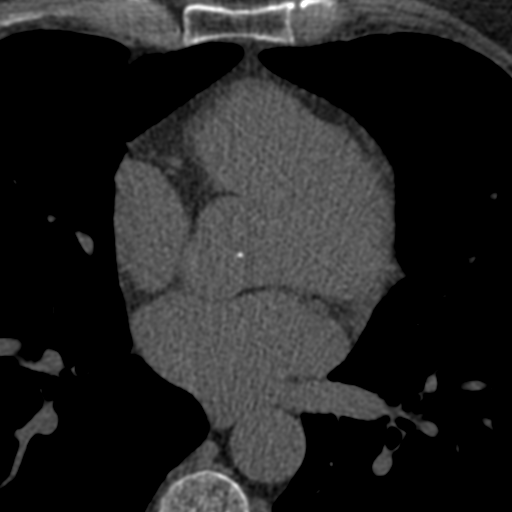
[im 51/64  vessel]
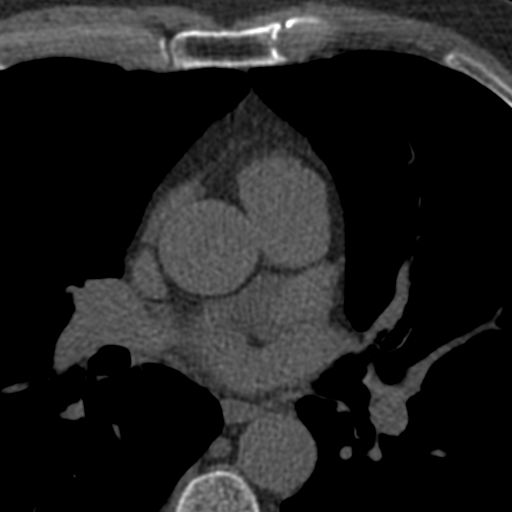

[Series 3: calcium scoring 2.00 br40 bestdiast 70% axial · axial · 0.64mm/px · z∈[+1702,+1786]mm · 5 of 64 slices shown, 7 images]
[im 11/64  vessel]
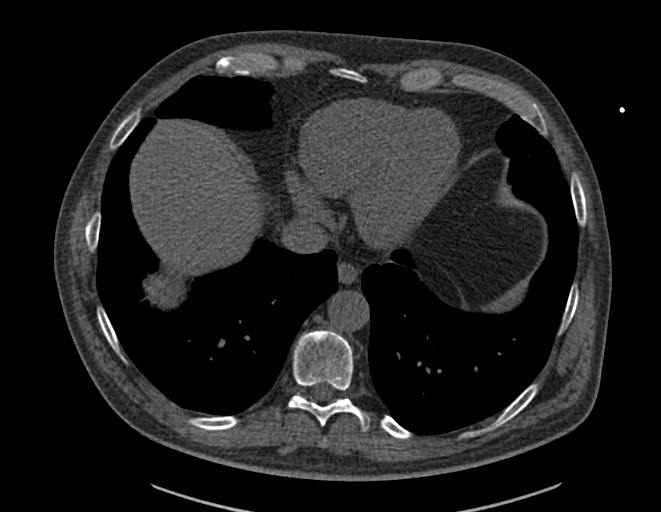
[im 11/64  lung]
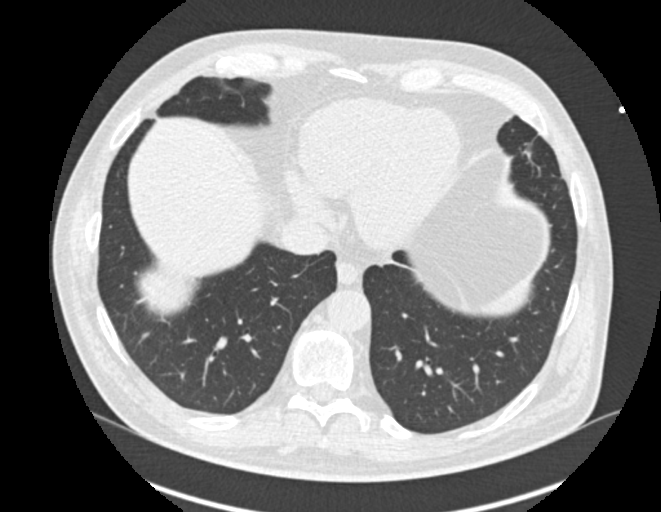
[im 22/64  vessel]
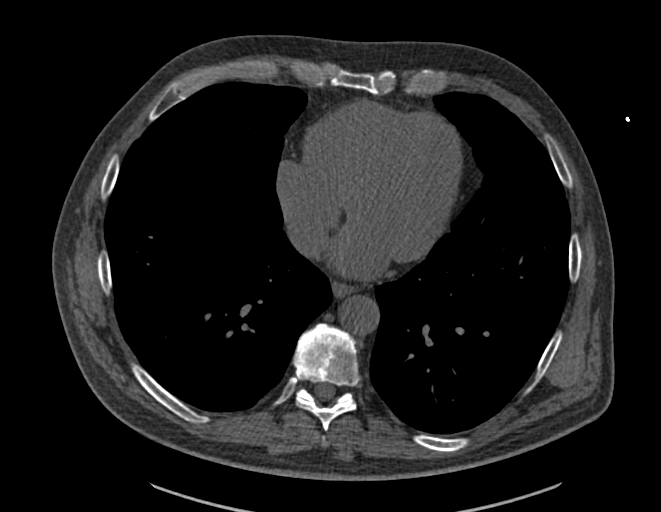
[im 32/64  vessel]
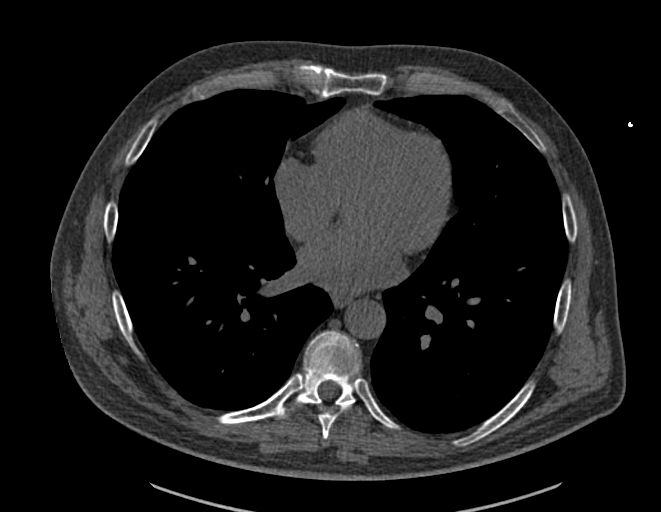
[im 43/64  vessel]
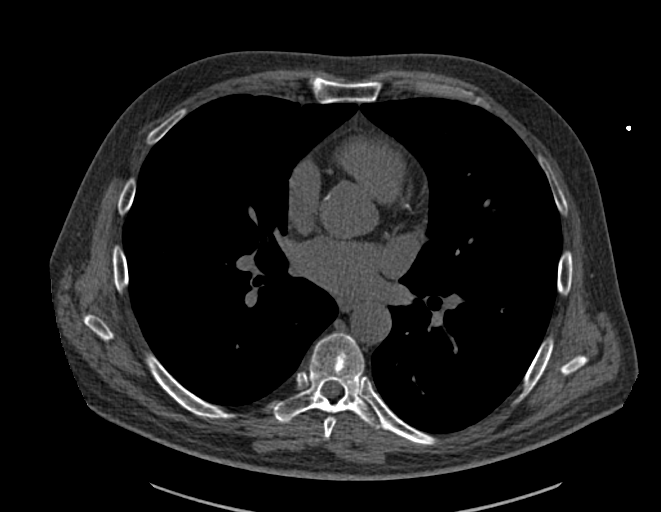
[im 53/64  vessel]
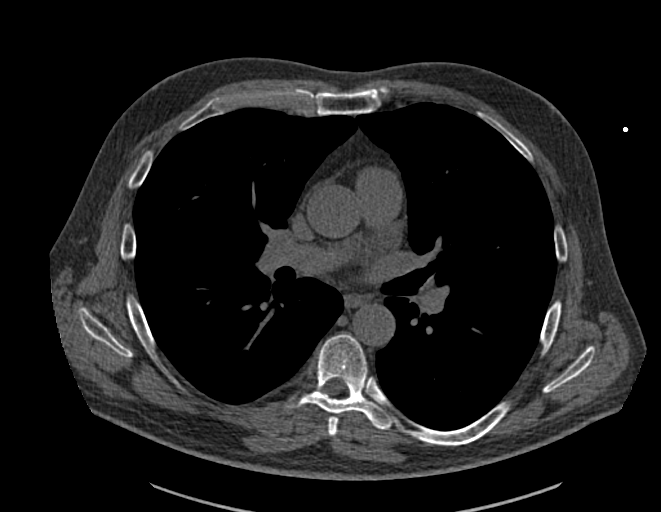
[im 53/64  lung]
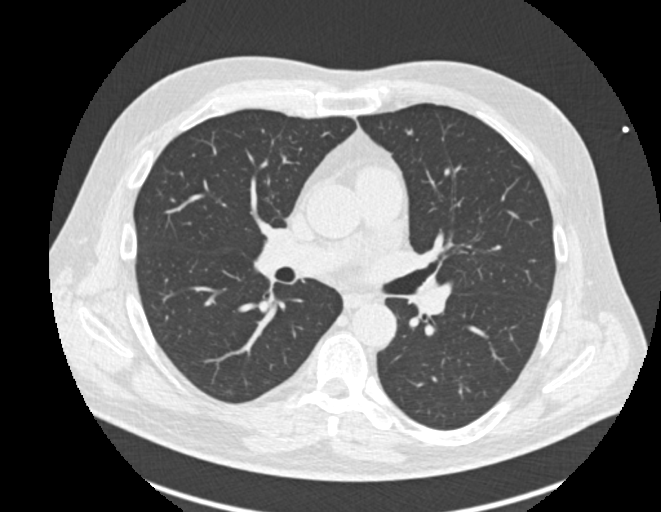

[Series 9: calcium scoring 2.00 br60 bestdiast 70% lungs · axial · 0.64mm/px · z∈[+1702,+1786]mm · 5 of 64 slices shown]
[im 11/64  vessel]
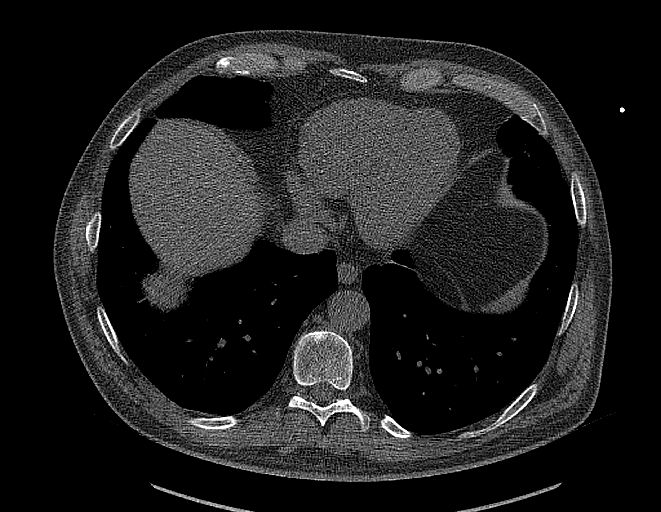
[im 22/64  vessel]
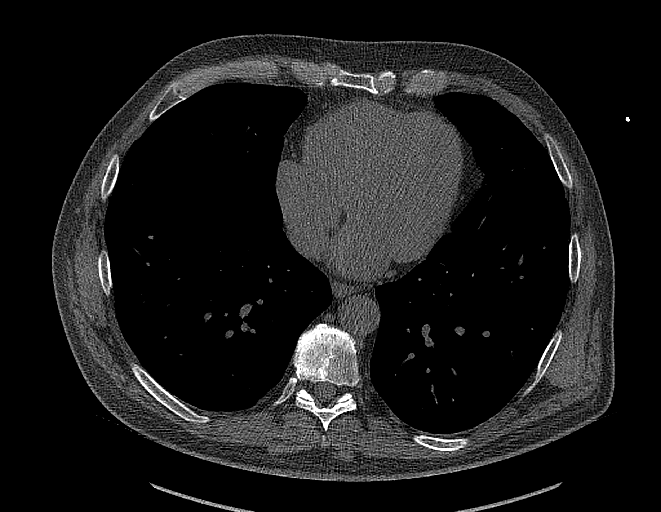
[im 32/64  vessel]
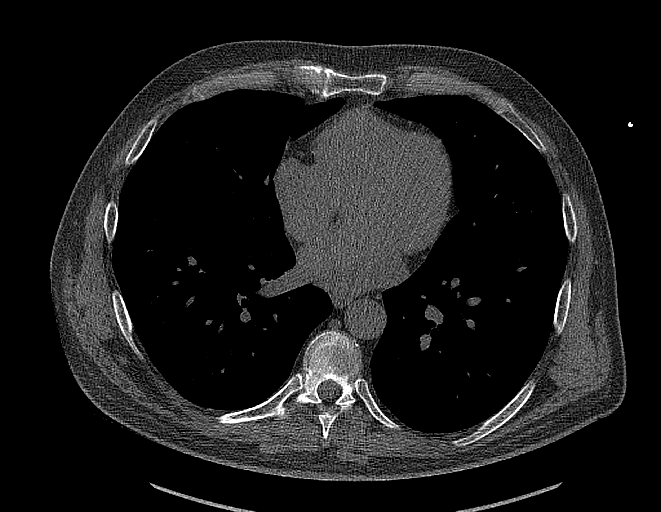
[im 43/64  vessel]
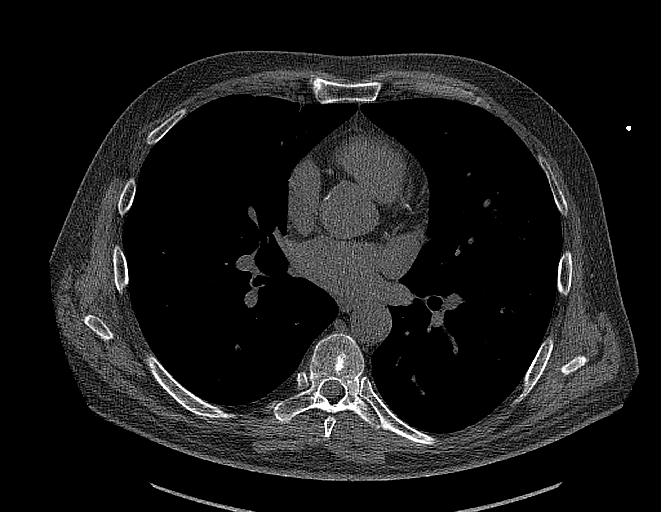
[im 53/64  vessel]
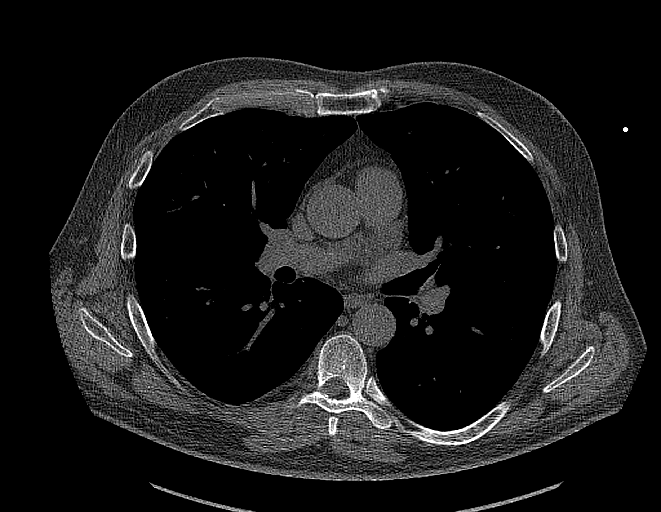

[14 of 20 positions shown; findings below may reference images not displayed]

FINDINGS: CORONARY CALCIUM SCORES:

Left Main: 0

LAD: 306

LCx: 24

RCA:

Total Agatston Score: 331

[HOSPITAL] percentile: 84

AORTA MEASUREMENTS:

Ascending Aorta: 35 mm

Descending Aorta: 25 mm

OTHER FINDINGS:

Heart is normal size. Aorta normal caliber. Scattered calcifications
in the aortic root. No aneurysm. No adenopathy. Small calcified
granulomas in the right lower lobe and left lower lobe. No effusions
or confluent opacities. Imaging into the upper abdomen demonstrates
no acute findings. Chest wall soft tissues are unremarkable. No
acute bony abnormality.
IMPRESSION: Total Agatston score: 331

[HOSPITAL] percentile: 84

Aortic root atherosclerosis.

Old granulomatous disease.

No acute findings.

## 2023-04-14 ENCOUNTER — Other Ambulatory Visit: Payer: Self-pay | Admitting: Medical

## 2023-04-26 ENCOUNTER — Telehealth: Payer: 59 | Admitting: Physician Assistant

## 2023-04-26 DIAGNOSIS — L255 Unspecified contact dermatitis due to plants, except food: Secondary | ICD-10-CM

## 2023-04-26 MED ORDER — PREDNISONE 10 MG PO TABS
ORAL_TABLET | ORAL | 0 refills | Status: DC
Start: 2023-04-26 — End: 2023-06-16

## 2023-04-26 NOTE — Progress Notes (Signed)
E-Visit for Poison Ivy  We are sorry that you are not feeing well.  Here is how we plan to help!  Based on what you have shared with me it looks like you have had an allergic reaction to the oily resin from a group of plants.  This resin is very sticky, so it easily attaches to your skin, clothing, tools equipment, and pet's fur.    This blistering rash is often called poison ivy rash although it can come from contact with the leaves, stems and roots of poison ivy, poison oak and poison sumac.  The oily resin contains urushiol (u-ROO-she-ol) that produces a skin rash on exposed skin.  The severity of the rash depends on the amount of urushiol that gets on your skin.  A section of skin with more urushiol on it may develop a rash sooner.  The rash usually develops 12-48 hours after exposure and can last two to three weeks.  Your skin must come in direct contact with the plant's oil to be affected.  Blister fluid doesn't spread the rash.  However, if you come into contact with a piece of clothing or pet fur that has urushiol on it, the rash may spread out.  You can also transfer the oil to other parts of your body with your fingers.  Often the rash looks like a straight line because of the way the plant brushes against your skin.  Since your rash is widespread or has resulted in a large number of blisters, I have prescribed an oral corticosteroid.  Please follow these recommendations:  I have sent a prednisone dose pack to your chosen pharmacy. Be sure to follow the instructions carefully and complete the entire prescription. You may use Benadryl or Caladryl topical lotions to sooth the itch and remember cool, not hot, showers and baths can help relieve the itching!  Place cool, wet compresses on the affected area for 15-30 minutes several times a day.  You may also take oral antihistamines, such as diphenhydramine (Benadryl, others), which may also help you sleep better.  Watch your skin for any purulent  (pus) drainage or red streaking from the site.  If this occurs, contact your provider.  You may require an antibiotic for a skin infection.  Make sure that the clothes you were wearing as well as any towels or sheets that may have come in contact with the oil (urushiol) are washed in detergent and hot water.       I have developed the following plan to treat your condition I am prescribing a two week course of steroids (37 tablets of 10 mg prednisone).  Days 1-4 take 4 tablets (40 mg) daily  Days 5-8 take 3 tablets (30 mg) daily, Days 9-11 take 2 tablets (20 mg) daily, Days 12-14 take 1 tablet (10 mg) daily.    What can you do to prevent this rash?  Avoid the plants.  Learn how to identify poison ivy, poison oak and poison sumac in all seasons.  When hiking or engaging in other activities that might expose you to these plants, try to stay on cleared pathways.  If camping, make sure you pitch your tent in an area free of these plants.  Keep pets from running through wooded areas so that urushiol doesn't accidentally stick to their fur, which you may touch.  Remove or kill the plants.  In your yard, you can get rid of poison ivy by applying an herbicide or pulling it out of   the ground, including the roots, while wearing heavy gloves.  Afterward remove the gloves and thoroughly wash them and your hands.  Don't burn poison ivy or related plants because the urushiol can be carried by smoke.  Wear protective clothing.  If needed, protect your skin by wearing socks, boots, pants, long sleeves and vinyl gloves.  Wash your skin right away.  Washing off the oil with soap and water within 30 minutes of exposure may reduce your chances of getting a poison ivy rash.  Even washing after an hour or so can help reduce the severity of the rash.  If you walk through some poison ivy and then later touch your shoes, you may get some urushiol on your hands, which may then transfer to your face or body by touching or  rubbing.  If the contaminated object isn't cleaned, the urushiol on it can still cause a skin reaction years later.    Be careful not to reuse towels after you have washed your skin.  Also carefully wash clothing in detergent and hot water to remove all traces of the oil.  Handle contaminated clothing carefully so you don't transfer the urushiol to yourself, furniture, rugs or appliances.  Remember that pets can carry the oil on their fur and paws.  If you think your pet may be contaminated with urushiol, put on some long rubber gloves and give your pet a bath.  Finally, be careful not to burn these plants as the smoke can contain traces of the oil.  Inhaling the smoke may result in difficulty breathing. If that occurred you should see a physician as soon as possible.  See your doctor right away if:  The reaction is severe or widespread You inhaled the smoke from burning poison ivy and are having difficulty breathing Your skin continues to swell The rash affects your eyes, mouth or genitals Blisters are oozing pus You develop a fever greater than 100 F (37.8 C) The rash doesn't get better within a few weeks.  If you scratch the poison ivy rash, bacteria under your fingernails may cause the skin to become infected.  See your doctor if pus starts oozing from the blisters.  Treatment generally includes antibiotics.  Poison ivy treatments are usually limited to self-care methods.  And the rash typically goes away on its own in two to three weeks.     If the rash is widespread or results in a large number of blisters, your doctor may prescribe an oral corticosteroid, such as prednisone.  If a bacterial infection has developed at the rash site, your doctor may give you a prescription for an oral antibiotic.  MAKE SURE YOU  Understand these instructions. Will watch your condition. Will get help right away if you are not doing well or get worse.   Thank you for choosing an e-visit.  Your  e-visit answers were reviewed by a board certified advanced clinical practitioner to complete your personal care plan. Depending upon the condition, your plan could have included both over the counter or prescription medications.  Please review your pharmacy choice. Make sure the pharmacy is open so you can pick up prescription now. If there is a problem, you may contact your provider through MyChart messaging and have the prescription routed to another pharmacy.  Your safety is important to us. If you have drug allergies check your prescription carefully.   For the next 24 hours you can use MyChart to ask questions about today's visit, request a non-urgent   call back, or ask for a work or school excuse. You will get an email in the next two days asking about your experience. I hope that your e-visit has been valuable and will speed your recovery.   I have spent 5 minutes in review of e-visit questionnaire, review and updating patient chart, medical decision making and response to patient.   Laymon Stockert M Geffrey Michaelsen, PA-C  

## 2023-06-16 ENCOUNTER — Ambulatory Visit (INDEPENDENT_AMBULATORY_CARE_PROVIDER_SITE_OTHER): Payer: 59 | Admitting: Medical

## 2023-06-16 ENCOUNTER — Encounter: Payer: Self-pay | Admitting: Medical

## 2023-06-16 VITALS — BP 104/64 | HR 77 | Ht 72.5 in | Wt 177.0 lb

## 2023-06-16 DIAGNOSIS — Z125 Encounter for screening for malignant neoplasm of prostate: Secondary | ICD-10-CM | POA: Diagnosis not present

## 2023-06-16 DIAGNOSIS — R209 Unspecified disturbances of skin sensation: Secondary | ICD-10-CM

## 2023-06-16 DIAGNOSIS — L989 Disorder of the skin and subcutaneous tissue, unspecified: Secondary | ICD-10-CM | POA: Diagnosis not present

## 2023-06-16 DIAGNOSIS — R0989 Other specified symptoms and signs involving the circulatory and respiratory systems: Secondary | ICD-10-CM | POA: Diagnosis not present

## 2023-06-16 DIAGNOSIS — Z0000 Encounter for general adult medical examination without abnormal findings: Secondary | ICD-10-CM

## 2023-06-16 DIAGNOSIS — E785 Hyperlipidemia, unspecified: Secondary | ICD-10-CM | POA: Diagnosis not present

## 2023-06-16 LAB — POCT URINALYSIS DIP (PROADVANTAGE DEVICE)
Blood, UA: NEGATIVE
Glucose, UA: NEGATIVE mg/dL
Leukocytes, UA: NEGATIVE
Nitrite, UA: NEGATIVE
Protein Ur, POC: NEGATIVE mg/dL
Specific Gravity, Urine: 1.03
Urobilinogen, Ur: NEGATIVE
pH, UA: 6 (ref 5.0–8.0)

## 2023-06-16 NOTE — Progress Notes (Signed)
Subjective:   HPI  Devin Nunez is a 62 y.o. male who presents for Chief Complaint  Patient presents with   Annual Exam    Fasting cpe, toes get numb if not wearing socks    Patient Care Team: Adis Sturgill, Cleda Mccreedy as PCP - General (Family Medicine) Sees dentist Sees eye doctor, Dr. Dione Booze Dr. Tressia Danas, GI   Concerns: Gets numb or cold in feet if not wearing socks.     Wants to know if he has to continue statin.  Has worked hard in last year to lose weight through healthy diet and exercise efforts  Wasn't happy with last dermatology visit, wants to try different office.   Reviewed their medical, surgical, family, social, medication, and allergy history and updated chart as appropriate.  Past Medical History:  Diagnosis Date   Elevated PSA 2008   hx/o with benign biopsy   HLD (hyperlipidemia)    Hordeolum 2015   Dr. Dione Booze, prior Doxycycline therapy   PVC (premature ventricular contraction)    Rosacea    takes Doxycycline every other day, uses facial creams;  [facility]   Shingles 2008   chest/back   Wisdom teeth removed     Past Surgical History:  Procedure Laterality Date   COLONOSCOPY  04/2014   normal colonoscopy, Dr. Loreta Ave   COLONOSCOPY  2024   normal colon   INGUINAL HERNIA REPAIR Right 06/14/2020   Procedure: HERNIA REPAIR INGUINAL ADULT RIGHT WITH MESH;  Surgeon: Griselda Miner, MD;  Location: [facility] OR;  Service: General;  Laterality: Right;  GENERAL AND TAP BLOCK   PROSTATE BIOPSY  2008   reportedly benign   WISDOM TOOTH EXTRACTION      Family History  Problem Relation Age of Onset   Other Mother        died 33 of old age   Heart disease Father 81       MI, CABG   Heart attack Father    Colon cancer Cousin 58   Diabetes Neg Hx    Hypertension Neg Hx    Stroke Neg Hx      Current Outpatient Medications:    rosuvastatin (CRESTOR) 10 MG tablet, TAKE 1 TABLET BY MOUTH EVERY DAY, Disp: 90 tablet, Rfl: 0  No Known Allergies  Review  of Systems  Constitutional:  Negative for chills, fever, malaise/fatigue and weight loss.  HENT:  Negative for congestion, ear pain, hearing loss, sore throat and tinnitus.   Eyes:  Negative for blurred vision, pain and redness.  Respiratory:  Negative for cough, hemoptysis and shortness of breath.   Cardiovascular:  Negative for chest pain, palpitations, orthopnea, claudication and leg swelling.  Gastrointestinal:  Negative for abdominal pain, blood in stool, constipation, diarrhea, nausea and vomiting.  Genitourinary:  Negative for dysuria, flank pain, frequency, hematuria and urgency.  Musculoskeletal:  Negative for falls, joint pain and myalgias.  Skin:  Negative for itching and rash.       Changing moles, wants lesion on left face removed  Neurological:  Positive for tingling. Negative for dizziness, speech change, weakness and headaches.  Endo/Heme/Allergies:  Negative for polydipsia. Does not bruise/bleed easily.  Psychiatric/Behavioral:  Negative for depression and memory loss. The patient is not nervous/anxious and does not have insomnia.        06/16/2023    1:41 PM 06/12/2022    2:45 PM 06/10/2021   12:26 PM 02/20/2020    8:32 AM 10/30/2016    8:48 AM  Depression  screen PHQ 2/9  Decreased Interest 0 0 0 0 0  Down, Depressed, Hopeless 0 0 0 0 0  PHQ - 2 Score 0 0 0 0 0        Objective:  BP 104/64   Pulse 77   Ht 6' 0.5" (1.842 m)   Wt 177 lb (80.3 kg)   BMI 23.68 kg/m   General appearance: alert, no distress, WD/WN, Caucasian male Skin: scattered macules, 1 lesion of left cheek approx 1cm diameter, irregular border, pink/flesh colored, otherwise unremarkable HEENT: normocephalic, conjunctiva/corneas normal, sclerae anicteric, PERRLA, EOMi, nares patent, no discharge or erythema, pharynx normal Oral cavity: MMM, tongue normal, teeth normal Neck: supple, no lymphadenopathy, no thyromegaly, no masses, normal ROM, no bruits Chest: non tender, normal shape and  expansion Heart: RRR, normal S1, S2, no murmurs Lungs: CTA bilaterally, no wheezes, rhonchi, or rales Abdomen: +bs, soft, non tender, non distended, no masses, no hepatomegaly, no splenomegaly, no bruits Back: non tender, normal ROM, no scoliosis Musculoskeletal: upper extremities non tender, no obvious deformity, normal ROM throughout, lower extremities non tender, no obvious deformity, normal ROM throughout Extremities: no edema, no cyanosis, no clubbing Pulses: 2+ symmetric, upper and lower extremities, normal cap refill Neurological: alert, oriented x 3, CN2-12 intact, strength normal upper extremities and lower extremities, sensation normal throughout, DTRs 2+ throughout, no cerebellar signs, gait normal Psychiatric: normal affect, behavior normal, pleasant  GU: declined Rectal: declined   Assessment and Plan :   Encounter Diagnoses  Name Primary?   Encounter for health maintenance examination in adult Yes   Screening for prostate cancer    Hyperlipidemia, unspecified hyperlipidemia type    Decreased pedal pulses    Skin lesion    Cold extremities     This visit was a preventative care visit, also known as wellness visit or routine physical.   Topics typically include healthy lifestyle, diet, exercise, preventative care, vaccinations, sick and well care, proper use of emergency dept and after hours care, as well as other concerns.     Recommendations: Continue to return yearly for your annual wellness and preventative care visits.  This gives [place] a chance to discuss healthy lifestyle, exercise, vaccinations, review your chart record, and perform screenings where appropriate.  I recommend you see your eye doctor yearly for routine vision care.  I recommend you see your dentist yearly for routine dental care including hygiene visits twice yearly.   Vaccination recommendations were reviewed Immunization History  Administered Date(s) Administered   PFIZER(Purple Top)SARS-COV-2  Vaccination 12/30/2019, 01/20/2020   Tdap 02/15/2014, 09/15/2018   Tetanus 09/15/2018   Zoster Recombinant(Shingrix) 12/16/2021, 02/18/2022   I recommend yearly flu shot in the fall  Screening for cancer: Colon cancer screening: I reviewed your colonoscopy on file that is up to date from 2023  We discussed PSA, prostate exam, and prostate cancer screening risks/benefits.     Skin cancer screening: Check your skin regularly for new changes, growing lesions, or other lesions of concern Come in for evaluation if you have skin lesions of concern.  Lung cancer screening: If you have a greater than 20 pack year history of tobacco use, then you may qualify for lung cancer screening with a chest CT scan.   Please call your insurance company to inquire about coverage for this test.  We currently don't have screenings for other cancers besides breast, cervical, colon, and lung cancers.  If you have a strong family history of cancer or have other cancer screening concerns, please let  me know.    Bone health: Get at least 150 minutes of aerobic exercise weekly Get weight bearing exercise at least once weekly Bone density test:  A bone density test is an imaging test that uses a type of X-ray to measure the amount of calcium and other minerals in your bones. The test may be used to diagnose or screen you for a condition that causes weak or thin bones (osteoporosis), predict your risk for a broken bone (fracture), or determine how well your osteoporosis treatment is working. The bone density test is recommended for females 65 and older, or females or males <65 if certain risk factors such as thyroid disease, long term use of steroids such as for asthma or rheumatological issues, vitamin D deficiency, estrogen deficiency, family history of osteoporosis, self or family history of fragility fracture in first degree relative.    Heart health: Get at least 150 minutes of aerobic exercise weekly Limit  alcohol It is important to maintain a healthy blood pressure and healthy cholesterol numbers  Heart disease screening: Screening for heart disease includes screening for blood pressure, fasting lipids, glucose/diabetes screening, BMI height to weight ratio, reviewed of smoking status, physical activity, and diet.    Goals include blood pressure 120/80 or less, maintaining a healthy lipid/cholesterol profile, preventing diabetes or keeping diabetes numbers under good control, not smoking or using tobacco products, exercising most days per week or at least 150 minutes per week of exercise, and eating healthy variety of fruits and vegetables, healthy oils, and avoiding unhealthy food choices like fried food, fast food, high sugar and high cholesterol foods.    Other tests may possibly include EKG test, CT coronary calcium score, echocardiogram, exercise treadmill stress test.   I reviewed the transthoracic echocardiogram done Mar 21, 2014 showing normal LVEF to 55%, mild to moderate mitral regurgitation, slight left atrial enlargement, Dr. Viann Fish  CT coronary calcium score/2/22: IMPRESSION: Total Agatston score: 331  [place] database percentile: 84  Aortic root atherosclerosis.  Old granulomatous disease.  No acute findings.    Medical care options: I recommend you continue to seek care here first for routine care.  We try really hard to have available appointments Monday through Friday daytime hours for sick visits, acute visits, and physicals.  Urgent care should be used for after hours and weekends for significant issues that cannot wait till the next day.  The emergency department should be used for significant potentially life-threatening emergencies.  The emergency department is expensive, can often have long wait times for less significant concerns, so try to utilize primary care, urgent care, or telemedicine when possible to avoid unnecessary trips to the emergency department.   Virtual visits and telemedicine have been introduced since the pandemic started in 2020, and can be convenient ways to receive medical care.  We offer virtual appointments as well to assist you in a variety of options to seek medical care.   Advanced Directives: I recommend you consider completing a Health Care Power of Attorney and Living Will.   These documents respect your wishes and help alleviate burdens on your loved ones if you were to become terminally ill or be in a position to need those documents enforced.    You can complete Advanced Directives yourself, have them notarized, then have copies made for our office, for you and for anybody you feel should have them in safe keeping.  Or, you can have an attorney prepare these documents.   If you haven't updated  your Last Will and Testament in a while, it may be worthwhile having an attorney prepare these documents together and save on some costs.       Separate significant issues discussed: Hyperlipidemia - on statin.  Labs today  Referral to dermatology, new consult with different office  Numbness of toes, cold extremities, possible raynauds - labs today, referral for ABIs.   Benjermin was seen today for annual exam.  Diagnoses and all orders for this visit:  Encounter for health maintenance examination in adult -     Comprehensive metabolic panel -     CBC -     Lipid panel -     PSA, total and free -     POCT Urinalysis DIP (Proadvantage Device) -     TSH -     Vitamin B12 -     ANA -     Ambulatory referral to Dermatology  Screening for prostate cancer -     PSA, total and free  Hyperlipidemia, unspecified hyperlipidemia type -     Lipid panel  Decreased pedal pulses -     Comprehensive metabolic panel -     CBC -     Vitamin B12 -     ANA -     VAS [place] ABI WITH/WO TBI; Future  Skin lesion -     Ambulatory referral to Dermatology  Cold extremities -     Vitamin B12 -     VAS [place] ABI WITH/WO TBI;  Future     Follow-up pending labs, yearly for physical

## 2023-06-17 ENCOUNTER — Other Ambulatory Visit: Payer: Self-pay | Admitting: Medical

## 2023-06-17 LAB — TSH: TSH: 0.846 u[IU]/mL (ref 0.450–4.500)

## 2023-06-17 LAB — LIPID PANEL
Chol/HDL Ratio: 2.7 ratio (ref 0.0–5.0)
Cholesterol, Total: 146 mg/dL (ref 100–199)
HDL: 55 mg/dL (ref >39)
LDL Chol Calc (NIH): 77 mg/dL (ref 0–99)
Triglycerides: 71 mg/dL (ref 0–149)
VLDL Cholesterol Cal: 14 mg/dL (ref 5–40)

## 2023-06-17 LAB — COMPREHENSIVE METABOLIC PANEL
ALT: 16 IU/L (ref 0–44)
AST: 20 IU/L (ref 0–40)
Albumin: 4.5 g/dL (ref 3.9–4.9)
Alkaline Phosphatase: 55 IU/L (ref 44–121)
BUN/Creatinine Ratio: 17 (ref 10–24)
BUN: 15 mg/dL (ref 8–27)
Bilirubin Total: 0.5 mg/dL (ref 0.0–1.2)
CO2: 21 mmol/L (ref 20–29)
Calcium: 9.8 mg/dL (ref 8.6–10.2)
Chloride: 103 mmol/L (ref 96–106)
Creatinine, Ser: 0.86 mg/dL (ref 0.76–1.27)
Globulin, Total: 2.3 g/dL (ref 1.5–4.5)
Glucose: 82 mg/dL (ref 70–99)
Potassium: 4.3 mmol/L (ref 3.5–5.2)
Sodium: 141 mmol/L (ref 134–144)
Total Protein: 6.8 g/dL (ref 6.0–8.5)
eGFR: 97 mL/min/{1.73_m2} (ref >59)

## 2023-06-17 LAB — CBC
Hematocrit: 44.8 % (ref 37.5–51.0)
Hemoglobin: 14.8 g/dL (ref 13.0–17.7)
MCH: 29.2 pg (ref 26.6–33.0)
MCHC: 33 g/dL (ref 31.5–35.7)
MCV: 89 fL (ref 79–97)
Platelets: 267 10*3/uL (ref 150–450)
RBC: 5.06 x10E6/uL (ref 4.14–5.80)
RDW: 12.3 % (ref 11.6–15.4)
WBC: 7.6 10*3/uL (ref 3.4–10.8)

## 2023-06-17 LAB — PSA, TOTAL AND FREE
PSA, Free Pct: 24.1 %
PSA, Free: 0.65 ng/mL
Prostate Specific Ag, Serum: 2.7 ng/mL (ref 0.0–4.0)

## 2023-06-17 LAB — ANA: Anti Nuclear Antibody (ANA): NEGATIVE

## 2023-06-17 LAB — VITAMIN B12: Vitamin B-12: 294 pg/mL (ref 232–1245)

## 2023-06-17 MED ORDER — ROSUVASTATIN CALCIUM 10 MG PO TABS: 10.0000 mg | ORAL_TABLET | Freq: Every day | ORAL | 3 refills | Status: DC

## 2023-06-17 NOTE — Progress Notes (Signed)
Results sent through MyChart

## 2023-07-01 ENCOUNTER — Other Ambulatory Visit: Payer: Self-pay | Admitting: Medical Genetics

## 2023-07-01 DIAGNOSIS — Z006 Encounter for examination for normal comparison and control in clinical research program: Secondary | ICD-10-CM

## 2023-07-02 ENCOUNTER — Ambulatory Visit (HOSPITAL_COMMUNITY)
Admission: RE | Admit: 2023-07-02 | Discharge: 2023-07-02 | Disposition: A | Discharge status: Home / Self Care | Payer: 59 | Source: Ambulatory Visit | Attending: Medical | Admitting: Medical

## 2023-07-02 DIAGNOSIS — R209 Unspecified disturbances of skin sensation: Secondary | ICD-10-CM | POA: Insufficient documentation

## 2023-07-02 DIAGNOSIS — R0989 Other specified symptoms and signs involving the circulatory and respiratory systems: Secondary | ICD-10-CM | POA: Diagnosis not present

## 2023-07-02 LAB — VAS US ABI WITH/WO TBI
Left ABI: 1.26
Right ABI: 1.22

## 2023-07-05 NOTE — Progress Notes (Signed)
Results sent through MyChart

## 2023-07-12 DIAGNOSIS — L718 Other rosacea: Secondary | ICD-10-CM | POA: Diagnosis not present

## 2023-07-12 DIAGNOSIS — Z129 Encounter for screening for malignant neoplasm, site unspecified: Secondary | ICD-10-CM | POA: Diagnosis not present

## 2023-07-12 DIAGNOSIS — D225 Melanocytic nevi of trunk: Secondary | ICD-10-CM | POA: Diagnosis not present

## 2023-07-12 DIAGNOSIS — L821 Other seborrheic keratosis: Secondary | ICD-10-CM | POA: Diagnosis not present

## 2023-07-12 DIAGNOSIS — L820 Inflamed seborrheic keratosis: Secondary | ICD-10-CM | POA: Diagnosis not present

## 2023-07-12 DIAGNOSIS — D2271 Melanocytic nevi of right lower limb, including hip: Secondary | ICD-10-CM | POA: Diagnosis not present

## 2023-07-12 DIAGNOSIS — D2272 Melanocytic nevi of left lower limb, including hip: Secondary | ICD-10-CM | POA: Diagnosis not present

## 2023-07-12 DIAGNOSIS — L570 Actinic keratosis: Secondary | ICD-10-CM | POA: Diagnosis not present

## 2023-07-12 DIAGNOSIS — L814 Other melanin hyperpigmentation: Secondary | ICD-10-CM | POA: Diagnosis not present

## 2023-08-27 ENCOUNTER — Other Ambulatory Visit (HOSPITAL_COMMUNITY)
Admission: RE | Admit: 2023-08-27 | Discharge: 2023-08-27 | Disposition: A | Discharge status: Home / Self Care | Payer: 59 | Source: Ambulatory Visit | Attending: Medical Genetics | Admitting: Medical Genetics

## 2023-08-27 DIAGNOSIS — Z006 Encounter for examination for normal comparison and control in clinical research program: Secondary | ICD-10-CM | POA: Insufficient documentation

## 2023-09-04 LAB — HELIX MOLECULAR SCREEN: Genetic Analysis Overall Interpretation: NEGATIVE

## 2023-09-04 LAB — GENECONNECT MOLECULAR SCREEN

## 2023-10-04 ENCOUNTER — Emergency Department (HOSPITAL_COMMUNITY): Admission: EM | Admit: 2023-10-04 | Discharge: 2023-10-05 | Discharge status: Against Medical Advice | Payer: 59

## 2023-10-04 NOTE — ED Notes (Signed)
Called for triage, no answer

## 2023-10-04 NOTE — ED Notes (Signed)
Pt called X2 for vitals.

## 2023-10-06 DIAGNOSIS — H04123 Dry eye syndrome of bilateral lacrimal glands: Secondary | ICD-10-CM | POA: Diagnosis not present

## 2023-10-06 DIAGNOSIS — H2513 Age-related nuclear cataract, bilateral: Secondary | ICD-10-CM | POA: Diagnosis not present

## 2023-10-06 DIAGNOSIS — H02834 Dermatochalasis of left upper eyelid: Secondary | ICD-10-CM | POA: Diagnosis not present

## 2023-10-06 DIAGNOSIS — H02831 Dermatochalasis of right upper eyelid: Secondary | ICD-10-CM | POA: Diagnosis not present

## 2023-10-25 ENCOUNTER — Telehealth: Payer: Self-pay

## 2023-10-25 NOTE — Progress Notes (Signed)
Transition Care Management Unsuccessful Follow-up Telephone Call  Date of discharge and from where:  [facility] 12/10  Attempts:  1st Attempt  Reason for unsuccessful TCM follow-up call:  No answer/busy   Lenard Forth [place]  [facility], [facility] Guide, Phone: [phone] Website: [email or web address]

## 2023-10-25 NOTE — Progress Notes (Signed)
 Transition Care Management Unsuccessful Follow-up Telephone Call  Date of discharge and from where:  [facility]/10  Attempts:  2nd Attempt  Reason for unsuccessful TCM follow-up call:  No answer/busy   Lenard Forth [place]  [facility], [employer] Guide, Phone: [phone] Website: [email or web address]

## 2024-04-30 ENCOUNTER — Other Ambulatory Visit: Payer: Self-pay | Admitting: Medical

## 2024-05-01 NOTE — Telephone Encounter (Signed)
  He should have enough to last him until 06/16/24. I will go ahead and refill for additional 30 days to get him to his appt at the end of August with Ludie

## 2024-05-23 ENCOUNTER — Other Ambulatory Visit: Payer: Self-pay | Admitting: Medical

## 2024-06-16 ENCOUNTER — Other Ambulatory Visit: Payer: Self-pay | Admitting: Medical

## 2024-06-21 ENCOUNTER — Encounter: Payer: Self-pay | Admitting: Medical

## 2024-06-21 ENCOUNTER — Ambulatory Visit (INDEPENDENT_AMBULATORY_CARE_PROVIDER_SITE_OTHER): Payer: 59 | Admitting: Medical

## 2024-06-21 VITALS — BP 110/68 | HR 69 | Ht 72.5 in | Wt 177.8 lb

## 2024-06-21 DIAGNOSIS — E785 Hyperlipidemia, unspecified: Secondary | ICD-10-CM

## 2024-06-21 DIAGNOSIS — Z125 Encounter for screening for malignant neoplasm of prostate: Secondary | ICD-10-CM

## 2024-06-21 DIAGNOSIS — Z7185 Encounter for immunization safety counseling: Secondary | ICD-10-CM | POA: Diagnosis not present

## 2024-06-21 DIAGNOSIS — Z9889 Other specified postprocedural states: Secondary | ICD-10-CM

## 2024-06-21 DIAGNOSIS — Z1329 Encounter for screening for other suspected endocrine disorder: Secondary | ICD-10-CM

## 2024-06-21 DIAGNOSIS — Z0000 Encounter for general adult medical examination without abnormal findings: Secondary | ICD-10-CM

## 2024-06-21 LAB — LIPID PANEL

## 2024-06-21 LAB — MICROSCOPIC EXAMINATION

## 2024-06-21 NOTE — Progress Notes (Signed)
 Subjective:   HPI  Devin Nunez is a 63 y.o. male who presents for Chief Complaint  Patient presents with   Annual Exam    Fasting cpe, no concerns, declines pneumonia shot    Patient Care Team: Alaric Gladwin, Alm GORMAN RIGGERS as PCP - General (Family Medicine) Sees dentist Sees eye doctor, Dr. Octavia Dr. Suzen Brass, GI Dermatology  Concerns: Taking Crestor  every day.  Curious about doing updated CT coronary test.   Brother this past year had 4 stents placed.   Was playing golf this week and may have pulled a muscle of left hip.  Mild pain.  Doing some stretching.  Exercise regularly with biking, weights, other cardio.  Plans to get back in pool and lap swim too.  Reviewed their medical, surgical, family, social, medication, and allergy history and updated chart as appropriate.  Past Medical History:  Diagnosis Date   Elevated PSA 2008   hx/o with benign biopsy   HLD (hyperlipidemia)    Hordeolum 2015   Dr. Octavia, prior Doxycycline therapy   PVC (premature ventricular contraction)    Rosacea    takes Doxycycline every other day, uses facial creams;  [facility]   Shingles 2008   chest/back   Wisdom teeth removed     Past Surgical History:  Procedure Laterality Date   COLONOSCOPY  04/2014   normal colonoscopy, Dr. Kristie   COLONOSCOPY  2024   normal colon   INGUINAL HERNIA REPAIR Right 06/14/2020   Procedure: HERNIA REPAIR INGUINAL ADULT RIGHT WITH MESH;  Surgeon: Curvin Deward MOULD, MD;  Location: [facility] OR;  Service: General;  Laterality: Right;  GENERAL AND TAP BLOCK   PROSTATE BIOPSY  2008   reportedly benign   WISDOM TOOTH EXTRACTION      Family History  Problem Relation Age of Onset   Other Mother        died 25 of old age   Heart disease Father 48       MI, CABG   Heart attack Father    Heart disease Brother 61       4 stents, CAD   Colon cancer Cousin 51   Diabetes Neg Hx    Hypertension Neg Hx    Stroke Neg Hx      Current Outpatient Medications:     rosuvastatin  (CRESTOR ) 10 MG tablet, TAKE 1 TABLET BY MOUTH EVERY DAY, Disp: 30 tablet, Rfl: 0  No Known Allergies  Review of Systems  Constitutional:  Negative for chills, fever, malaise/fatigue and weight loss.  HENT:  Negative for congestion, ear pain, hearing loss, sore throat and tinnitus.   Eyes:  Negative for blurred vision, pain and redness.  Respiratory:  Negative for cough, hemoptysis and shortness of breath.   Cardiovascular:  Negative for chest pain, palpitations, orthopnea, claudication and leg swelling.  Gastrointestinal:  Negative for abdominal pain, blood in stool, constipation, diarrhea, nausea and vomiting.  Genitourinary:  Negative for dysuria, flank pain, frequency, hematuria and urgency.  Musculoskeletal:  Positive for joint pain. Negative for falls and myalgias.  Skin:  Negative for itching and rash.  Neurological:  Negative for dizziness, tingling, speech change, weakness and headaches.  Endo/Heme/Allergies:  Negative for polydipsia. Does not bruise/bleed easily.  Psychiatric/Behavioral:  Negative for depression and memory loss. The patient is not nervous/anxious and does not have insomnia.         06/21/2024   10:57 AM 06/16/2023    1:41 PM 06/12/2022    2:45 PM 06/10/2021  12:26 PM 02/20/2020    8:32 AM  Depression screen PHQ 2/9  Decreased Interest 0 0 0 0 0  Down, Depressed, Hopeless 0 0 0 0 0  PHQ - 2 Score 0 0 0 0 0        Objective:  BP 110/68   Pulse 69   Ht 6' 0.5 (1.842 m)   Wt 177 lb 12.8 oz (80.6 kg)   SpO2 100%   BMI 23.78 kg/m   General appearance: alert, no distress, WD/WN, Caucasian male Skin: scattered macules, unremarkable HEENT: normocephalic, conjunctiva/corneas normal, sclerae anicteric, PERRLA, EOMi, nares patent, no discharge or erythema, pharynx normal Oral cavity: MMM, tongue normal, teeth normal Neck: supple, no lymphadenopathy, no thyromegaly, no masses, normal ROM, no bruits Chest: non tender, normal shape and  expansion Heart: RRR, normal S1, S2, no murmurs Lungs: CTA bilaterally, no wheezes, rhonchi, or rales Abdomen: +bs, soft, non tender, non distended, no masses, no hepatomegaly, no splenomegaly, no bruits Back: non tender, normal ROM, no scoliosis Musculoskeletal: upper extremities non tender, no obvious deformity, normal ROM throughout, lower extremities non tender, no obvious deformity, normal ROM throughout Extremities: no edema, no cyanosis, no clubbing Pulses: 2+ symmetric, upper and lower extremities, normal cap refill Neurological: alert, oriented x 3, CN2-12 intact, strength normal upper extremities and lower extremities, sensation normal throughout, DTRs 2+ throughout, no cerebellar signs, gait normal Psychiatric: normal affect, behavior normal, pleasant  GU: normal male, circ, reducible small left inguinal hernia, otherwise normal male, on mass, on lymphadenopathy Rectal: declined   Assessment and Plan :   Encounter Diagnoses  Name Primary?   Encounter for health maintenance examination in adult Yes   History of prostate biopsy    Hyperlipidemia, unspecified hyperlipidemia type    Vaccine counseling    Screening for prostate cancer    Screening for thyroid  disorder     This visit was a preventative care visit, also known as wellness visit or routine physical.   Topics typically include healthy lifestyle, diet, exercise, preventative care, vaccinations, sick and well care, proper use of emergency dept and after hours care, as well as other concerns.     Recommendations: Continue to return yearly for your annual wellness and preventative care visits.  This gives us  a chance to discuss healthy lifestyle, exercise, vaccinations, review your chart record, and perform screenings where appropriate.  I recommend you see your eye doctor yearly for routine vision care.  I recommend you see your dentist yearly for routine dental care including hygiene visits twice  yearly.   Vaccination recommendations were reviewed Immunization History  Administered Date(s) Administered   PFIZER(Purple Top)SARS-COV-2 Vaccination 12/30/2019, 01/20/2020   Tdap 02/15/2014, 09/15/2018, 09/13/2023   Tetanus 09/15/2018   Zoster Recombinant(Shingrix ) 12/16/2021, 02/18/2022   I recommend yearly flu shot in the fall Consider pneumococcal vaccine   Screening for cancer: Colon cancer screening: I reviewed your colonoscopy on file that is up to date from 2023.    We discussed PSA, prostate exam, and prostate cancer screening risks/benefits.     Skin cancer screening: Check your skin regularly for new changes, growing lesions, or other lesions of concern Come in for evaluation if you have skin lesions of concern.  Lung cancer screening: If you have a greater than 20 pack year history of tobacco use, then you may qualify for lung cancer screening with a chest CT scan.   Please call your insurance company to inquire about coverage for this test.  We currently don't have screenings for  other cancers besides breast, cervical, colon, and lung cancers.  If you have a strong family history of cancer or have other cancer screening concerns, please let me know.    Bone health: Get at least 150 minutes of aerobic exercise weekly Get weight bearing exercise at least once weekly Bone density test:  A bone density test is an imaging test that uses a type of X-ray to measure the amount of calcium  and other minerals in your bones. The test may be used to diagnose or screen you for a condition that causes weak or thin bones (osteoporosis), predict your risk for a broken bone (fracture), or determine how well your osteoporosis treatment is working. The bone density test is recommended for females 65 and older, or females or males <65 if certain risk factors such as thyroid  disease, long term use of steroids such as for asthma or rheumatological issues, vitamin D deficiency, estrogen  deficiency, family history of osteoporosis, self or family history of fragility fracture in first degree relative.    Heart health: Get at least 150 minutes of aerobic exercise weekly Limit alcohol It is important to maintain a healthy blood pressure and healthy cholesterol numbers  Heart disease screening: Screening for heart disease includes screening for blood pressure, fasting lipids, glucose/diabetes screening, BMI height to weight ratio, reviewed of smoking status, physical activity, and diet.    Goals include blood pressure 120/80 or less, maintaining a healthy lipid/cholesterol profile, preventing diabetes or keeping diabetes numbers under good control, not smoking or using tobacco products, exercising most days per week or at least 150 minutes per week of exercise, and eating healthy variety of fruits and vegetables, healthy oils, and avoiding unhealthy food choices like fried food, fast food, high sugar and high cholesterol foods.    Other tests may possibly include EKG test, CT coronary calcium  score, echocardiogram, exercise treadmill stress test.   I reviewed the transthoracic echocardiogram done Mar 21, 2014 showing normal LVEF to 55%, mild to moderate mitral regurgitation, slight left atrial enlargement, Dr. Jacques Somerset  CT coronary calcium  score/2/22: IMPRESSION: Total Agatston score: 331  [place] database percentile: 84  Aortic root atherosclerosis.  Old granulomatous disease.  No acute findings.    Medical care options: I recommend you continue to seek care here first for routine care.  We try really hard to have available appointments Monday through Friday daytime hours for sick visits, acute visits, and physicals.  Urgent care should be used for after hours and weekends for significant issues that cannot wait till the next day.  The emergency department should be used for significant potentially life-threatening emergencies.  The emergency department is expensive, can  often have long wait times for less significant concerns, so try to utilize primary care, urgent care, or telemedicine when possible to avoid unnecessary trips to the emergency department.  Virtual visits and telemedicine have been introduced since the pandemic started in 2020, and can be convenient ways to receive medical care.  We offer virtual appointments as well to assist you in a variety of options to seek medical care.   Advanced Directives: I recommend you consider completing a Health Care Power of Attorney and Living Will.   These documents respect your wishes and help alleviate burdens on your loved ones if you were to become terminally ill or be in a position to need those documents enforced.    You can complete Advanced Directives yourself, have them notarized, then have copies made for our office, for you  and for anybody you feel should have them in safe keeping.  Or, you can have an attorney prepare these documents.   If you haven't updated your Last Will and Testament in a while, it may be worthwhile having an attorney prepare these documents together and save on some costs.       Separate significant issues discussed: Hyperlipidemia - on statin, crestor  10mg  daily.  Labs today   Perry was seen today for annual exam.  Diagnoses and all orders for this visit:  Encounter for health maintenance examination in adult -     CBC -     Comprehensive metabolic panel with GFR -     Lipid panel -     Urinalysis, Routine w reflex microscopic -     PSA  History of prostate biopsy -     PSA  Hyperlipidemia, unspecified hyperlipidemia type -     Lipid panel  Vaccine counseling  Screening for prostate cancer -     PSA  Screening for thyroid  disorder    Follow-up pending labs, yearly for physical

## 2024-06-22 ENCOUNTER — Ambulatory Visit: Payer: Self-pay | Admitting: Medical

## 2024-06-22 ENCOUNTER — Other Ambulatory Visit: Payer: Self-pay | Admitting: Medical

## 2024-06-22 DIAGNOSIS — Z136 Encounter for screening for cardiovascular disorders: Secondary | ICD-10-CM

## 2024-06-22 DIAGNOSIS — Z8249 Family history of ischemic heart disease and other diseases of the circulatory system: Secondary | ICD-10-CM

## 2024-06-22 DIAGNOSIS — E785 Hyperlipidemia, unspecified: Secondary | ICD-10-CM

## 2024-06-22 LAB — URINALYSIS, ROUTINE W REFLEX MICROSCOPIC
Bilirubin, UA: NEGATIVE
Glucose, UA: NEGATIVE
Nitrite, UA: NEGATIVE
RBC, UA: NEGATIVE
Specific Gravity, UA: >=1.03 — AB (ref 1.005–1.030)
Urobilinogen, Ur: 1 mg/dL (ref 0.2–1.0)
pH, UA: 5.5 (ref 5.0–7.5)

## 2024-06-22 LAB — LIPID PANEL
Cholesterol, Total: 197 mg/dL (ref 100–199)
HDL: 80 mg/dL (ref >39)
LDL CALC COMMENT:: 2.5 ratio (ref 0.0–5.0)
LDL Chol Calc (NIH): 96 mg/dL (ref 0–99)
Triglycerides: 125 mg/dL (ref 0–149)
VLDL Cholesterol Cal: 21 mg/dL (ref 5–40)

## 2024-06-22 LAB — COMPREHENSIVE METABOLIC PANEL WITH GFR
ALT: 16 IU/L (ref 0–44)
AST: 27 IU/L (ref 0–40)
Albumin: 4.6 g/dL (ref 3.9–4.9)
Alkaline Phosphatase: 69 IU/L (ref 44–121)
BUN/Creatinine Ratio: 16 (ref 10–24)
BUN: 15 mg/dL (ref 8–27)
Bilirubin Total: 0.6 mg/dL (ref 0.0–1.2)
CO2: 20 mmol/L (ref 20–29)
Calcium: 9.8 mg/dL (ref 8.6–10.2)
Chloride: 101 mmol/L (ref 96–106)
Creatinine, Ser: 0.94 mg/dL (ref 0.76–1.27)
Globulin, Total: 2.6 g/dL (ref 1.5–4.5)
Glucose: 97 mg/dL (ref 70–99)
Potassium: 4.5 mmol/L (ref 3.5–5.2)
Sodium: 139 mmol/L (ref 134–144)
Total Protein: 7.2 g/dL (ref 6.0–8.5)
eGFR: 91 mL/min/1.73 (ref >59)

## 2024-06-22 LAB — CBC
Hematocrit: 47.9 % (ref 37.5–51.0)
Hemoglobin: 15.5 g/dL (ref 13.0–17.7)
MCH: 31.2 pg (ref 26.6–33.0)
MCHC: 32.4 g/dL (ref 31.5–35.7)
MCV: 96 fL (ref 79–97)
Platelets: 267 x10E3/uL (ref 150–450)
RBC: 4.97 x10E6/uL (ref 4.14–5.80)
RDW: 11.9 % (ref 11.6–15.4)
WBC: 7.3 x10E3/uL (ref 3.4–10.8)

## 2024-06-22 LAB — MICROSCOPIC EXAMINATION
Casts: NONE SEEN
Epithelial Cells (non renal): NONE SEEN /HPF (ref 0–10)
Renal Epithel, UA: NONE SEEN /LPF

## 2024-06-22 LAB — PSA: Prostate Specific Ag, Serum: 3 ng/mL (ref 0.0–4.0)

## 2024-06-22 MED ORDER — ROSUVASTATIN CALCIUM 10 MG PO TABS: 10.0000 mg | ORAL_TABLET | Freq: Every day | ORAL | 2 refills | Status: DC

## 2024-06-22 NOTE — Progress Notes (Signed)
 Labs through [employer]

## 2024-07-12 ENCOUNTER — Ambulatory Visit: Payer: Self-pay | Admitting: Medical

## 2024-07-12 ENCOUNTER — Ambulatory Visit (HOSPITAL_BASED_OUTPATIENT_CLINIC_OR_DEPARTMENT_OTHER)
Admission: RE | Admit: 2024-07-12 | Discharge: 2024-07-12 | Disposition: A | Discharge status: Home / Self Care | Payer: Self-pay | Source: Ambulatory Visit | Attending: Medical | Admitting: Medical

## 2024-07-12 DIAGNOSIS — E785 Hyperlipidemia, unspecified: Secondary | ICD-10-CM | POA: Insufficient documentation

## 2024-07-12 DIAGNOSIS — Z136 Encounter for screening for cardiovascular disorders: Secondary | ICD-10-CM | POA: Insufficient documentation

## 2024-07-12 DIAGNOSIS — Z8249 Family history of ischemic heart disease and other diseases of the circulatory system: Secondary | ICD-10-CM | POA: Insufficient documentation

## 2024-07-12 NOTE — Progress Notes (Signed)
 Results through MyChart

## 2024-07-23 NOTE — Progress Notes (Signed)
 Results to MyChart

## 2024-07-25 ENCOUNTER — Ambulatory Visit: Admitting: Medical

## 2024-07-25 DIAGNOSIS — R9389 Abnormal findings on diagnostic imaging of other specified body structures: Secondary | ICD-10-CM

## 2024-07-25 DIAGNOSIS — I2510 Atherosclerotic heart disease of native coronary artery without angina pectoris: Secondary | ICD-10-CM | POA: Diagnosis not present

## 2024-07-25 DIAGNOSIS — Z8249 Family history of ischemic heart disease and other diseases of the circulatory system: Secondary | ICD-10-CM | POA: Diagnosis not present

## 2024-07-25 DIAGNOSIS — E785 Hyperlipidemia, unspecified: Secondary | ICD-10-CM

## 2024-07-25 MED ORDER — ROSUVASTATIN CALCIUM 20 MG PO TABS: 20.0000 mg | ORAL_TABLET | Freq: Every day | ORAL | 3 refills | Status: AC

## 2024-07-25 MED ORDER — ASPIRIN 81 MG PO TBEC: 81.0000 mg | DELAYED_RELEASE_TABLET | Freq: Every day | ORAL | 3 refills | Status: AC

## 2024-07-25 NOTE — Progress Notes (Signed)
 Name: Devin Nunez   Date of Visit: 07/25/24   Date of last visit with me: 06/21/2024   CHIEF COMPLAINT:  Chief Complaint  Patient presents with   Follow-up    Follow-up and needs EKG       HPI:  Discussed the use of AI scribe software for clinical note transcription with the patient, who gave verbal consent to proceed.  History of Present Illness  Devin Nunez is a 63 year old male with coronary artery disease who presents for follow-up after a CT coronary test.  He is following up after a CT coronary test which showed an increase in his coronary artery calcium  score from 331 in 2022 to 433 currently. The majority of the cholesterol plaque is located in the left anterior descending artery. He is currently on Crestor  (rosuvastatin ) 10 mg daily for cholesterol management. No cardiac symptoms such as chest pain, shortness of breath, or lightheadedness.  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  He has a history of a fall at age 52 or 17 from a single-story roof, landing on his buttocks, which caused significant pain at the time. A recent test noted an L1 compression deformity, which he believes may be related to this old injury. He denies any recent back pain or loss of height.  Recent labs, including liver, kidney, electrolytes, blood counts, and prostate-specific antigen (PSA), were reviewed. His PSA was 3.0, slightly up from 2.7 a year ago, but has been stable in the past.  No other aggravating or relieving factors. No other complaint.  Past Medical History:  Diagnosis Date   Elevated PSA 2008   hx/o with benign biopsy   HLD (hyperlipidemia)    Hordeolum 2015   Dr. Octavia, prior Doxycycline therapy   PVC (premature ventricular contraction)    Rosacea    takes Doxycycline every other day, uses facial creams;  [facility]   Shingles 2008   chest/back   Wisdom teeth removed    Current Outpatient Medications on File Prior to Visit   Medication Sig Dispense Refill   rosuvastatin  (CRESTOR ) 10 MG tablet Take 1 tablet (10 mg total) by mouth daily. 90 tablet 2   No current facility-administered medications on file prior to visit.   Family History  Problem Relation Age of Onset   Other Mother        died 59 of old age   Heart disease Father 18       MI, CABG   Heart attack Father    Heart disease Brother 59       4 stents, CAD   Colon cancer Cousin 36   Diabetes Neg Hx    Hypertension Neg Hx    Stroke Neg Hx    ROS as in subjective   Objective: There were no vitals taken for this visit.  Gen: wd, wn, nad Otherwise not examined     Assessment and Plan Encounter Diagnoses  Name Primary?   Coronary artery disease involving native coronary artery of native heart without angina pectoris Yes   Family history of heart disease    Abnormal CT scan    Hyperlipidemia, unspecified hyperlipidemia type      Coronary atherosclerosis of left anterior descending artery and aorta Coronary atherosclerosis with increased calcium  score on recent scan 07/21/24. EKG today without ischemia findings.  No recent symptoms of concern.   - Increase rosuvastatin  to 20 mg daily. - Initiate aspirin  81 mg daily. -  Educated on cardiac symptoms and emergency care. - Plan cardiology referral next year.  He wanted to defer for now -continue healthy diet and regular exercise which he is doing.  Hyperlipidemia Managed with rosuvastatin  10 mg daily. Cholesterol levels well-managed. Increased calcium  score indicates need for higher dosage. - Increase rosuvastatin  to 20 mg daily.   L1 compression deformity Likely from fall 47 years ago. No significant symptoms. No action unless symptoms develop. - Monitor for symptoms or changes. - Consider lumbar spine x-ray if symptoms develop.  Prostate health PSA level 3, slightly increased from 2.7. Discussed prostate cancer potential, but PSA stable. - Consider repeating PSA in 4-6  months.  General Health Maintenance Discussed lifestyle modifications for cardiovascular risk management. - Advise regular exercise and healthy diet.  [name] was seen today for follow-up.  Diagnoses and all orders for this visit:  Coronary artery disease involving native coronary artery of native heart without angina pectoris -     EKG 12-Lead  Family history of heart disease  Abnormal CT scan  Hyperlipidemia, unspecified hyperlipidemia type  Other orders -     rosuvastatin  (CRESTOR ) 20 MG tablet; Take 1 tablet (20 mg total) by mouth daily. -     aspirin  EC 81 MG tablet; Take 1 tablet (81 mg total) by mouth daily.    F/u with call back in march 2026.

## 2024-08-30 ENCOUNTER — Ambulatory Visit: Payer: Self-pay | Admitting: Medical

## 2024-08-30 VITALS — BP 122/72 | HR 81 | Temp 98.6°F | Wt 183.6 lb

## 2024-08-30 DIAGNOSIS — S76012A Strain of muscle, fascia and tendon of left hip, initial encounter: Secondary | ICD-10-CM | POA: Diagnosis not present

## 2024-08-30 DIAGNOSIS — M7632 Iliotibial band syndrome, left leg: Secondary | ICD-10-CM

## 2024-08-30 MED ORDER — NAPROXEN 375 MG PO TBEC: 1.0000 | DELAYED_RELEASE_TABLET | Freq: Two times a day (BID) | ORAL | 0 refills | Status: AC

## 2024-08-30 NOTE — Progress Notes (Signed)
 Subjective: Chief Complaint  Patient presents with   Acute Visit    Left leg discomfort when he steps. Slipped while playing golf 11 weeks and still having issues.    History of Present Illness Devin Nunez is a 63 year old male who presents with left hip discomfort following an injury sustained while playing golf.  Eleven weeks ago, he slipped during a golf swing and heard a 'crack' in his hip joint, similar to cracking knuckles. This was followed by pain and limping for a few days. He is now able to walk but experiences constant discomfort in the left upper leg, particularly on the lateral side, without pain. The discomfort is noticeable when walking but not when riding a bike.  No numbness, tingling, swelling, or bruising in the leg. He has not taken any medications for this issue. Current medications include Crestor  and aspirin . The discomfort is more pronounced when pressure is applied to the foot and during certain movements, such as pushing out with the leg or walking for extended periods.  Initially, he experienced groin pain and was unsure if he would be able to walk after the incident, but he managed to do so after a few days. He describes the discomfort as persistent and is concerned about living with it long-term.  No back pain, numbness, tingling, swelling, or bruising in the leg. Discomfort occurs when pressure is applied to the foot and during certain movements.  Past Medical History:  Diagnosis Date   Elevated PSA 2008   hx/o with benign biopsy   HLD (hyperlipidemia)    Hordeolum 2015   Dr. Octavia, prior Doxycycline therapy   PVC (premature ventricular contraction)    Rosacea    takes Doxycycline every other day, uses facial creams;  [facility]   Shingles 2008   chest/back   Wisdom teeth removed    Current Outpatient Medications on File Prior to Visit  Medication Sig Dispense Refill   aspirin  EC 81 MG tablet Take 1 tablet (81 mg total) by mouth daily. 90 tablet  3   rosuvastatin  (CRESTOR ) 20 MG tablet Take 1 tablet (20 mg total) by mouth daily. 90 tablet 3   No current facility-administered medications on file prior to visit.   ROS as in subjective   Objective: BP 122/72   Pulse 81   Temp 98.6 F (37 C)   Wt 183 lb 9.6 oz (83.3 kg)   SpO2 99%   BMI 24.56 kg/m   Physical Exam Gen: wd, wn, nad MSK: Mild tenderness of left lateral thigh and IT band, mildly discomfort over left lateral hip normal hip range of motion, no dislocation, no swelling, no bruising or deformity, nontender over the anterior pelvis Rest of legs unremarkable Legs neurovascularly intact Back nontender normal range of motion    Assessment and Plan Encounter Diagnoses  Name Primary?   Iliotibial band syndrome of left side Yes   Strain of left hip adductor muscle, initial encounter    We discussed possible etiology.  Discussed case with supervising physician Dr. Joyce Advised stretching and I gave him several stretches to do daily for the next 2 weeks We discussed resistance bands to do some hip abductor and hip range of motion exercises after 5 to 7 days of initial relative rest and stretching Begin Naprosyn  twice daily short-term for 7 to 10 days for inflammation and pain Advised heat 20 minutes 3 times a day for the next 5 days Advised the call report or MyChart email within  2 weeks.  If not seeing significant improvement then we will have him see Dr. Vita sports medicine here  Devin Nunez was seen today for acute visit.  Diagnoses and all orders for this visit:  Iliotibial band syndrome of left side  Strain of left hip adductor muscle, initial encounter  Other orders -     Naproxen  375 MG TBEC; Take 1 tablet (375 mg total) by mouth in the morning and at bedtime for 10 days.    F/u call back 2 wk

## 2025-06-28 ENCOUNTER — Encounter: Payer: Self-pay | Admitting: Medical
